# Patient Record
Sex: Female | Born: 1955 | Race: White | Hispanic: No | Smoking: Former smoker
Health system: Southern US, Community
[De-identification: ages and names within clinical notes are randomized; demographics above are authoritative.]

## PROBLEM LIST (undated history)

## (undated) DIAGNOSIS — T8859XA Other complications of anesthesia, initial encounter: Secondary | ICD-10-CM

## (undated) DIAGNOSIS — T4145XA Adverse effect of unspecified anesthetic, initial encounter: Secondary | ICD-10-CM

---

## 1997-03-22 HISTORY — PX: VAGINAL HYSTERECTOMY: SUR661

## 1997-07-31 ENCOUNTER — Inpatient Hospital Stay (HOSPITAL_COMMUNITY): Admission: RE | Admit: 1997-07-31 | Discharge: 1997-08-01 | Payer: Self-pay | Admitting: Gynecology

## 1998-03-28 ENCOUNTER — Other Ambulatory Visit: Admission: RE | Admit: 1998-03-28 | Discharge: 1998-03-28 | Payer: Self-pay | Admitting: Gynecology

## 1998-03-31 ENCOUNTER — Other Ambulatory Visit: Admission: RE | Admit: 1998-03-31 | Discharge: 1998-03-31 | Payer: Self-pay | Admitting: Gynecology

## 2000-06-10 ENCOUNTER — Other Ambulatory Visit: Admission: RE | Admit: 2000-06-10 | Discharge: 2000-06-10 | Payer: Self-pay | Admitting: Gynecology

## 2001-08-31 ENCOUNTER — Other Ambulatory Visit: Admission: RE | Admit: 2001-08-31 | Discharge: 2001-08-31 | Payer: Self-pay | Admitting: Gynecology

## 2006-08-28 ENCOUNTER — Emergency Department (HOSPITAL_COMMUNITY): Admission: EM | Admit: 2006-08-28 | Discharge: 2006-08-28 | Payer: Self-pay | Admitting: Emergency Medicine

## 2011-01-07 LAB — COMPREHENSIVE METABOLIC PANEL
Albumin: 3.8
Alkaline Phosphatase: 92
BUN: 6
Chloride: 106
Creatinine, Ser: 0.85
GFR calc non Af Amer: >60
Glucose, Bld: 105 — ABNORMAL HIGH
Potassium: 3.9
Total Bilirubin: 0.6

## 2011-01-07 LAB — CBC
HCT: 45.9
Hemoglobin: 15.6 — ABNORMAL HIGH
MCV: 90.9
Platelets: 333
WBC: 13.4 — ABNORMAL HIGH

## 2011-01-07 LAB — URINALYSIS, ROUTINE W REFLEX MICROSCOPIC
Glucose, UA: NEGATIVE
Hgb urine dipstick: NEGATIVE
Ketones, ur: NEGATIVE
Protein, ur: NEGATIVE
pH: 6.5

## 2011-01-07 LAB — DIFFERENTIAL
Basophils Absolute: 0
Basophils Relative: 0
Lymphocytes Relative: 15
Monocytes Absolute: 1 — ABNORMAL HIGH
Neutro Abs: 10.2 — ABNORMAL HIGH
Neutrophils Relative %: 76

## 2011-01-07 LAB — LIPASE, BLOOD: Lipase: 21

## 2014-09-02 ENCOUNTER — Emergency Department (HOSPITAL_COMMUNITY): Payer: Self-pay

## 2014-09-02 ENCOUNTER — Observation Stay (HOSPITAL_COMMUNITY): Payer: Self-pay | Admitting: Anesthesiology

## 2014-09-02 ENCOUNTER — Encounter (HOSPITAL_COMMUNITY): Payer: Self-pay | Admitting: *Deleted

## 2014-09-02 ENCOUNTER — Observation Stay (HOSPITAL_COMMUNITY)
Admission: EM | Admit: 2014-09-02 | Discharge: 2014-09-03 | Disposition: A | Discharge status: Home / Self Care | Payer: Self-pay | Attending: General Surgery | Admitting: General Surgery

## 2014-09-02 ENCOUNTER — Encounter (HOSPITAL_COMMUNITY)
Admission: EM | Disposition: A | Discharge status: Home / Self Care | Payer: Self-pay | Source: Home / Self Care | Attending: Emergency Medicine

## 2014-09-02 DIAGNOSIS — K8010 Calculus of gallbladder with chronic cholecystitis without obstruction: Principal | ICD-10-CM | POA: Insufficient documentation

## 2014-09-02 DIAGNOSIS — I10 Essential (primary) hypertension: Secondary | ICD-10-CM | POA: Insufficient documentation

## 2014-09-02 DIAGNOSIS — Z87891 Personal history of nicotine dependence: Secondary | ICD-10-CM | POA: Insufficient documentation

## 2014-09-02 DIAGNOSIS — K219 Gastro-esophageal reflux disease without esophagitis: Secondary | ICD-10-CM | POA: Insufficient documentation

## 2014-09-02 DIAGNOSIS — K8000 Calculus of gallbladder with acute cholecystitis without obstruction: Secondary | ICD-10-CM | POA: Diagnosis present

## 2014-09-02 DIAGNOSIS — R52 Pain, unspecified: Secondary | ICD-10-CM

## 2014-09-02 DIAGNOSIS — K819 Cholecystitis, unspecified: Secondary | ICD-10-CM

## 2014-09-02 DIAGNOSIS — K8020 Calculus of gallbladder without cholecystitis without obstruction: Secondary | ICD-10-CM

## 2014-09-02 HISTORY — PX: CHOLECYSTECTOMY: SHX55

## 2014-09-02 HISTORY — DX: Other complications of anesthesia, initial encounter: T88.59XA

## 2014-09-02 HISTORY — DX: Adverse effect of unspecified anesthetic, initial encounter: T41.45XA

## 2014-09-02 LAB — CBC WITH DIFFERENTIAL/PLATELET
BASOS ABS: 0 10*3/uL (ref 0.0–0.1)
Basophils Relative: 0 % (ref 0–1)
EOS ABS: 0 10*3/uL (ref 0.0–0.7)
EOS PCT: 0 % (ref 0–5)
HCT: 38.6 % (ref 36.0–46.0)
Hemoglobin: 13.4 g/dL (ref 12.0–15.0)
LYMPHS ABS: 1.3 10*3/uL (ref 0.7–4.0)
LYMPHS PCT: 12 % (ref 12–46)
MCH: 30.2 pg (ref 26.0–34.0)
MCHC: 34.7 g/dL (ref 30.0–36.0)
MCV: 86.9 fL (ref 78.0–100.0)
Monocytes Absolute: 0.5 10*3/uL (ref 0.1–1.0)
Monocytes Relative: 4 % (ref 3–12)
NEUTROS PCT: 84 % — AB (ref 43–77)
Neutro Abs: 9 10*3/uL — ABNORMAL HIGH (ref 1.7–7.7)
PLATELETS: 276 10*3/uL (ref 150–400)
RBC: 4.44 MIL/uL (ref 3.87–5.11)
RDW: 12.5 % (ref 11.5–15.5)
WBC: 10.8 10*3/uL — AB (ref 4.0–10.5)

## 2014-09-02 LAB — URINALYSIS, ROUTINE W REFLEX MICROSCOPIC
Bilirubin Urine: NEGATIVE
GLUCOSE, UA: NEGATIVE mg/dL
Hgb urine dipstick: NEGATIVE
Ketones, ur: NEGATIVE mg/dL
LEUKOCYTES UA: NEGATIVE
Nitrite: NEGATIVE
PH: 7.5 (ref 5.0–8.0)
Protein, ur: NEGATIVE mg/dL
SPECIFIC GRAVITY, URINE: 1.044 — AB (ref 1.005–1.030)
Urobilinogen, UA: 0.2 mg/dL (ref 0.0–1.0)

## 2014-09-02 LAB — COMPREHENSIVE METABOLIC PANEL
ALBUMIN: 3.9 g/dL (ref 3.5–5.0)
ALT: 26 U/L (ref 14–54)
ANION GAP: 10 (ref 5–15)
AST: 20 U/L (ref 15–41)
Alkaline Phosphatase: 82 U/L (ref 38–126)
BUN: 9 mg/dL (ref 6–20)
CO2: 25 mmol/L (ref 22–32)
CREATININE: 0.93 mg/dL (ref 0.44–1.00)
Calcium: 9.3 mg/dL (ref 8.9–10.3)
Chloride: 100 mmol/L — ABNORMAL LOW (ref 101–111)
GFR calc Af Amer: >60 mL/min (ref >60)
GFR calc non Af Amer: >60 mL/min (ref >60)
Glucose, Bld: 158 mg/dL — ABNORMAL HIGH (ref 65–99)
Potassium: 3.9 mmol/L (ref 3.5–5.1)
Sodium: 135 mmol/L (ref 135–145)
TOTAL PROTEIN: 7 g/dL (ref 6.5–8.1)
Total Bilirubin: 0.4 mg/dL (ref 0.3–1.2)

## 2014-09-02 LAB — I-STAT TROPONIN, ED: Troponin i, poc: 0 ng/mL (ref 0.00–0.08)

## 2014-09-02 LAB — LIPASE, BLOOD: LIPASE: 23 U/L (ref 22–51)

## 2014-09-02 SURGERY — LAPAROSCOPIC CHOLECYSTECTOMY WITH INTRAOPERATIVE CHOLANGIOGRAM
Anesthesia: General | Site: Abdomen

## 2014-09-02 MED ORDER — PHENYLEPHRINE HCL 10 MG/ML IJ SOLN
INTRAMUSCULAR | Status: DC | PRN
  Administered 2014-09-02: 40 ug via INTRAVENOUS

## 2014-09-02 MED ORDER — SODIUM CHLORIDE 0.9 % IV SOLN
INTRAVENOUS | Status: DC
  Administered 2014-09-02 (×2): via INTRAVENOUS

## 2014-09-02 MED ORDER — HEMOSTATIC AGENTS (NO CHARGE) OPTIME
TOPICAL | Status: DC | PRN
  Administered 2014-09-02: 1 via TOPICAL

## 2014-09-02 MED ORDER — OXYCODONE-ACETAMINOPHEN 5-325 MG PO TABS
1.0000 | ORAL_TABLET | Freq: Once | ORAL | Status: AC
  Administered 2014-09-02: 1 via ORAL
  Filled 2014-09-02: qty 1

## 2014-09-02 MED ORDER — ONDANSETRON HCL 4 MG/2ML IJ SOLN: 4.0000 mg | Freq: Four times a day (QID) | INTRAMUSCULAR | Status: DC | PRN

## 2014-09-02 MED ORDER — FENTANYL CITRATE (PF) 250 MCG/5ML IJ SOLN
INTRAMUSCULAR | Status: AC
  Filled 2014-09-02: qty 5

## 2014-09-02 MED ORDER — SODIUM CHLORIDE 0.9 % IR SOLN
Status: DC | PRN
  Administered 2014-09-02: 1000 mL

## 2014-09-02 MED ORDER — HYDROMORPHONE HCL 1 MG/ML IJ SOLN
INTRAMUSCULAR | Status: AC
  Filled 2014-09-02: qty 1

## 2014-09-02 MED ORDER — ENOXAPARIN SODIUM 40 MG/0.4ML ~~LOC~~ SOLN
40.0000 mg | SUBCUTANEOUS | Status: DC
  Filled 2014-09-02: qty 0.4

## 2014-09-02 MED ORDER — BUPIVACAINE-EPINEPHRINE (PF) 0.25% -1:200000 IJ SOLN
INTRAMUSCULAR | Status: AC
  Filled 2014-09-02: qty 30

## 2014-09-02 MED ORDER — ACETAMINOPHEN 650 MG RE SUPP: 650.0000 mg | Freq: Four times a day (QID) | RECTAL | Status: DC | PRN

## 2014-09-02 MED ORDER — BUPIVACAINE-EPINEPHRINE 0.25% -1:200000 IJ SOLN
INTRAMUSCULAR | Status: DC | PRN
  Administered 2014-09-02: 11 mL

## 2014-09-02 MED ORDER — MIDAZOLAM HCL 2 MG/2ML IJ SOLN
INTRAMUSCULAR | Status: AC
  Filled 2014-09-02: qty 2

## 2014-09-02 MED ORDER — IOHEXOL 300 MG/ML  SOLN
100.0000 mL | Freq: Once | INTRAMUSCULAR | Status: AC | PRN
  Administered 2014-09-02: 100 mL via INTRAVENOUS

## 2014-09-02 MED ORDER — ACETAMINOPHEN 325 MG PO TABS: 650.0000 mg | ORAL_TABLET | Freq: Four times a day (QID) | ORAL | Status: DC | PRN

## 2014-09-02 MED ORDER — LACTATED RINGERS IV SOLN
INTRAVENOUS | Status: DC | PRN
  Administered 2014-09-02 (×2): via INTRAVENOUS

## 2014-09-02 MED ORDER — HYDROMORPHONE HCL 1 MG/ML IJ SOLN
1.0000 mg | INTRAMUSCULAR | Status: DC | PRN
  Administered 2014-09-02: 1 mg via INTRAVENOUS
  Filled 2014-09-02: qty 1

## 2014-09-02 MED ORDER — CIPROFLOXACIN IN D5W 400 MG/200ML IV SOLN: 400.0000 mg | Freq: Two times a day (BID) | INTRAVENOUS | Status: DC

## 2014-09-02 MED ORDER — KETOROLAC TROMETHAMINE 30 MG/ML IJ SOLN: 30.0000 mg | Freq: Once | INTRAMUSCULAR | Status: DC | PRN

## 2014-09-02 MED ORDER — IOHEXOL 300 MG/ML  SOLN
INTRAMUSCULAR | Status: DC | PRN
  Administered 2014-09-02: 1 mL

## 2014-09-02 MED ORDER — SUCCINYLCHOLINE CHLORIDE 20 MG/ML IJ SOLN
INTRAMUSCULAR | Status: DC | PRN
  Administered 2014-09-02: 100 mg via INTRAVENOUS

## 2014-09-02 MED ORDER — LACTATED RINGERS IV SOLN: INTRAVENOUS | Status: DC

## 2014-09-02 MED ORDER — HYDRALAZINE HCL 20 MG/ML IJ SOLN: 10.0000 mg | Freq: Four times a day (QID) | INTRAMUSCULAR | Status: DC | PRN

## 2014-09-02 MED ORDER — 0.9 % SODIUM CHLORIDE (POUR BTL) OPTIME
TOPICAL | Status: DC | PRN
  Administered 2014-09-02: 1000 mL

## 2014-09-02 MED ORDER — FENTANYL CITRATE (PF) 100 MCG/2ML IJ SOLN
50.0000 ug | INTRAMUSCULAR | Status: AC | PRN
  Administered 2014-09-02 (×2): 50 ug via INTRAVENOUS
  Filled 2014-09-02: qty 2

## 2014-09-02 MED ORDER — DEXAMETHASONE SODIUM PHOSPHATE 10 MG/ML IJ SOLN
INTRAMUSCULAR | Status: DC | PRN
  Administered 2014-09-02: 10 mg via INTRAVENOUS

## 2014-09-02 MED ORDER — GLYCOPYRROLATE 0.2 MG/ML IJ SOLN
INTRAMUSCULAR | Status: DC | PRN
  Administered 2014-09-02: 0.6 mg via INTRAVENOUS

## 2014-09-02 MED ORDER — PROPOFOL 10 MG/ML IV BOLUS
INTRAVENOUS | Status: DC | PRN
  Administered 2014-09-02: 125 mg via INTRAVENOUS

## 2014-09-02 MED ORDER — FENTANYL CITRATE (PF) 100 MCG/2ML IJ SOLN
INTRAMUSCULAR | Status: DC | PRN
Start: 2014-09-02 — End: 2014-09-02
  Administered 2014-09-02 (×3): 50 ug via INTRAVENOUS

## 2014-09-02 MED ORDER — ENOXAPARIN SODIUM 40 MG/0.4ML ~~LOC~~ SOLN: 40.0000 mg | SUBCUTANEOUS | Status: DC

## 2014-09-02 MED ORDER — METRONIDAZOLE IN NACL 5-0.79 MG/ML-% IV SOLN
500.0000 mg | Freq: Once | INTRAVENOUS | Status: AC
  Administered 2014-09-02: 500 mg via INTRAVENOUS
  Filled 2014-09-02: qty 100

## 2014-09-02 MED ORDER — ONDANSETRON 4 MG PO TBDP
8.0000 mg | ORAL_TABLET | Freq: Once | ORAL | Status: AC
  Administered 2014-09-02: 8 mg via ORAL
  Filled 2014-09-02: qty 2

## 2014-09-02 MED ORDER — CEFTRIAXONE SODIUM 2 G IJ SOLR
2.0000 g | INTRAMUSCULAR | Status: DC
  Administered 2014-09-02: 2 g via INTRAVENOUS
  Filled 2014-09-02: qty 2

## 2014-09-02 MED ORDER — ONDANSETRON HCL 4 MG/2ML IJ SOLN
INTRAMUSCULAR | Status: DC | PRN
  Administered 2014-09-02: 4 mg via INTRAVENOUS

## 2014-09-02 MED ORDER — HYDROMORPHONE HCL 1 MG/ML IJ SOLN
0.2500 mg | INTRAMUSCULAR | Status: DC | PRN
  Administered 2014-09-02 (×2): 0.5 mg via INTRAVENOUS

## 2014-09-02 MED ORDER — ONDANSETRON HCL 4 MG/2ML IJ SOLN
4.0000 mg | INTRAMUSCULAR | Status: DC | PRN
  Administered 2014-09-02: 4 mg via INTRAVENOUS
  Filled 2014-09-02: qty 2

## 2014-09-02 MED ORDER — ONDANSETRON HCL 4 MG/2ML IJ SOLN: 4.0000 mg | INTRAMUSCULAR | Status: DC | PRN

## 2014-09-02 MED ORDER — NEOSTIGMINE METHYLSULFATE 10 MG/10ML IV SOLN
INTRAVENOUS | Status: DC | PRN
  Administered 2014-09-02: 4 mg via INTRAVENOUS

## 2014-09-02 MED ORDER — LIDOCAINE HCL (CARDIAC) 20 MG/ML IV SOLN
INTRAVENOUS | Status: DC | PRN
  Administered 2014-09-02: 100 mg via INTRAVENOUS

## 2014-09-02 MED ORDER — OXYCODONE-ACETAMINOPHEN 5-325 MG PO TABS: 1.0000 | ORAL_TABLET | ORAL | Status: DC | PRN

## 2014-09-02 MED ORDER — IOHEXOL 300 MG/ML  SOLN
25.0000 mL | Freq: Once | INTRAMUSCULAR | Status: AC | PRN
  Administered 2014-09-02: 25 mL via INTRAVENOUS

## 2014-09-02 MED ORDER — HYDROMORPHONE HCL 1 MG/ML IJ SOLN: 0.5000 mg | INTRAMUSCULAR | Status: DC | PRN

## 2014-09-02 MED ORDER — HYDROCODONE-ACETAMINOPHEN 5-325 MG PO TABS: 1.0000 | ORAL_TABLET | ORAL | Status: DC | PRN

## 2014-09-02 MED ORDER — OXYCODONE HCL 5 MG/5ML PO SOLN: 5.0000 mg | Freq: Once | ORAL | Status: DC | PRN

## 2014-09-02 MED ORDER — ROCURONIUM BROMIDE 100 MG/10ML IV SOLN
INTRAVENOUS | Status: DC | PRN
  Administered 2014-09-02: 35 mg via INTRAVENOUS
  Administered 2014-09-02: 10 mg via INTRAVENOUS

## 2014-09-02 MED ORDER — OXYCODONE HCL 5 MG PO TABS: 5.0000 mg | ORAL_TABLET | Freq: Once | ORAL | Status: DC | PRN

## 2014-09-02 MED ORDER — PROPOFOL 10 MG/ML IV BOLUS
INTRAVENOUS | Status: AC
  Filled 2014-09-02: qty 20

## 2014-09-02 MED ORDER — PROMETHAZINE HCL 25 MG/ML IJ SOLN: 6.2500 mg | INTRAMUSCULAR | Status: DC | PRN

## 2014-09-02 MED ORDER — SODIUM CHLORIDE 0.9 % IV BOLUS (SEPSIS)
500.0000 mL | Freq: Once | INTRAVENOUS | Status: AC
  Administered 2014-09-02: 500 mL via INTRAVENOUS

## 2014-09-02 SURGICAL SUPPLY — 49 items
APPLIER CLIP 5 13 M/L LIGAMAX5 (MISCELLANEOUS) ×3
APR CLP MED LRG 5 ANG JAW (MISCELLANEOUS) ×1
BAG SPEC RTRVL 10 TROC 200 (ENDOMECHANICALS) ×1
BLADE SURG ROTATE 9660 (MISCELLANEOUS) IMPLANT
CANISTER SUCTION 2500CC (MISCELLANEOUS) ×3 IMPLANT
CHLORAPREP W/TINT 26ML (MISCELLANEOUS) ×3 IMPLANT
CLIP APPLIE 5 13 M/L LIGAMAX5 (MISCELLANEOUS) ×1 IMPLANT
CLOSURE WOUND 1/2 X4 (GAUZE/BANDAGES/DRESSINGS) ×1
COVER MAYO STAND STRL (DRAPES) ×1 IMPLANT
COVER SURGICAL LIGHT HANDLE (MISCELLANEOUS) ×3 IMPLANT
DEVICE TROCAR PUNCTURE CLOSURE (ENDOMECHANICALS) ×2 IMPLANT
DRAPE C-ARM 42X72 X-RAY (DRAPES) ×1 IMPLANT
DRAPE LAPAROSCOPIC ABDOMINAL (DRAPES) ×2 IMPLANT
DRAPE UTILITY XL STRL (DRAPES) ×2 IMPLANT
ELECT REM PT RETURN 9FT ADLT (ELECTROSURGICAL) ×3
ELECTRODE REM PT RTRN 9FT ADLT (ELECTROSURGICAL) ×1 IMPLANT
GLOVE BIO SURGEON STRL SZ 6.5 (GLOVE) ×1 IMPLANT
GLOVE BIO SURGEON STRL SZ7 (GLOVE) ×5 IMPLANT
GLOVE BIO SURGEONS STRL SZ 6.5 (GLOVE) ×1
GLOVE BIOGEL PI IND STRL 6.5 (GLOVE) IMPLANT
GLOVE BIOGEL PI IND STRL 7.0 (GLOVE) IMPLANT
GLOVE BIOGEL PI IND STRL 7.5 (GLOVE) ×1 IMPLANT
GLOVE BIOGEL PI INDICATOR 6.5 (GLOVE) ×2
GLOVE BIOGEL PI INDICATOR 7.0 (GLOVE) ×6
GLOVE BIOGEL PI INDICATOR 7.5 (GLOVE) ×6
GOWN STRL REUS W/ TWL LRG LVL3 (GOWN DISPOSABLE) ×3 IMPLANT
GOWN STRL REUS W/TWL LRG LVL3 (GOWN DISPOSABLE) ×12
HEMOSTAT SNOW SURGICEL 2X4 (HEMOSTASIS) ×2 IMPLANT
KIT BASIN OR (CUSTOM PROCEDURE TRAY) ×3 IMPLANT
KIT ROOM TURNOVER OR (KITS) ×3 IMPLANT
LIQUID BAND (GAUZE/BANDAGES/DRESSINGS) ×3 IMPLANT
NS IRRIG 1000ML POUR BTL (IV SOLUTION) ×3 IMPLANT
PAD ARMBOARD 7.5X6 YLW CONV (MISCELLANEOUS) ×3 IMPLANT
POUCH RETRIEVAL ECOSAC 10 (ENDOMECHANICALS) ×1 IMPLANT
POUCH RETRIEVAL ECOSAC 10MM (ENDOMECHANICALS) ×2
SCISSORS LAP 5X35 DISP (ENDOMECHANICALS) ×3 IMPLANT
SET CHOLANGIOGRAPH 5 50 .035 (SET/KITS/TRAYS/PACK) ×1 IMPLANT
SET IRRIG TUBING LAPAROSCOPIC (IRRIGATION / IRRIGATOR) ×3 IMPLANT
SLEEVE ENDOPATH XCEL 5M (ENDOMECHANICALS) ×6 IMPLANT
SPECIMEN JAR SMALL (MISCELLANEOUS) ×3 IMPLANT
STRIP CLOSURE SKIN 1/2X4 (GAUZE/BANDAGES/DRESSINGS) ×1 IMPLANT
SUT MNCRL AB 4-0 PS2 18 (SUTURE) ×3 IMPLANT
SUT VICRYL 0 UR6 27IN ABS (SUTURE) ×2 IMPLANT
TOWEL OR 17X24 6PK STRL BLUE (TOWEL DISPOSABLE) ×3 IMPLANT
TOWEL OR 17X26 10 PK STRL BLUE (TOWEL DISPOSABLE) ×1 IMPLANT
TRAY LAPAROSCOPIC (CUSTOM PROCEDURE TRAY) ×3 IMPLANT
TROCAR XCEL BLUNT TIP 100MML (ENDOMECHANICALS) ×3 IMPLANT
TROCAR XCEL NON-BLD 5MMX100MML (ENDOMECHANICALS) ×3 IMPLANT
TUBING INSUFFLATION (TUBING) ×3 IMPLANT

## 2014-09-02 NOTE — H&P (Signed)
Chief Complaint: abdominal pain HPI: Kelly Avery is a 59 year old female with no significant history presenting with right sided abdominal pain.  She reports having beans and cole slaw for dinner.  She awoke from a nap with abdominal pain, nausea, vomiting.  Previous symptoms in 2008 at which time it was recommended she has a cholecystectomy, but she got tired of waiting for surgery.  She has infrequent symptoms after she eats which usually resolve on their own and diet changes.  Symptoms are severe.  Time pattern is constant.  No modifying factors.  No aggravating or alleviating factors.  Characterized as "hurting pain."  Now improved to a 6/10.  Continues to have nausea, but no additional vomiting.  Last oral intake was 6PM.  She endorses having bradycardia and hard time "waking up from surgery" after her vaginal hysterectomy in 1999.  She does not take any medication. She does not have any family present and does not wish for [place] to call any at this point.  Denies chest pains, sob, DOE.    Work up shows; [place] with cholelithiasis, pericholecystic fluid and edema.  WBC 10.8, CT of A/P with cholelithiasis, normal LFTs.  Glucose 158.  Normal renal function and electrolytes, negative troponin.    Past Medical History  Diagnosis Date  . Anesthesia complication     bradycardia    Past Surgical History  Procedure Laterality Date  . Vaginal hysterectomy  1999    Family History  Problem Relation Age of Onset  . Alzheimer's disease Father   . Throat cancer Brother    Social History:  reports that she has quit smoking. She does not have any smokeless tobacco history on file. She reports that she does not drink alcohol or use illicit drugs.  Allergies: No Known Allergies   (Not in a hospital admission)  Results for orders placed or performed during the hospital encounter of 09/02/14 (from the past 48 hour(s))  CBC with Differential     Status: Abnormal   Collection Time: 09/02/14  3:43 AM   Result Value Ref Range   WBC 10.8 (H) 4.0 - 10.5 K/uL   RBC 4.44 3.87 - 5.11 MIL/uL   Hemoglobin 13.4 12.0 - 15.0 g/dL   HCT 38.6 36.0 - 46.0 %   MCV 86.9 78.0 - 100.0 fL   MCH 30.2 26.0 - 34.0 pg   MCHC 34.7 30.0 - 36.0 g/dL   RDW 12.5 11.5 - 15.5 %   Platelets 276 150 - 400 K/uL   Neutrophils Relative % 84 (H) 43 - 77 %   Neutro Abs 9.0 (H) 1.7 - 7.7 K/uL   Lymphocytes Relative 12 12 - 46 %   Lymphs Abs 1.3 0.7 - 4.0 K/uL   Monocytes Relative 4 3 - 12 %   Monocytes Absolute 0.5 0.1 - 1.0 K/uL   Eosinophils Relative 0 0 - 5 %   Eosinophils Absolute 0.0 0.0 - 0.7 K/uL   Basophils Relative 0 0 - 1 %   Basophils Absolute 0.0 0.0 - 0.1 K/uL  Comprehensive metabolic panel     Status: Abnormal   Collection Time: 09/02/14  3:43 AM  Result Value Ref Range   Sodium 135 135 - 145 mmol/L   Potassium 3.9 3.5 - 5.1 mmol/L   Chloride 100 (L) 101 - 111 mmol/L   CO2 25 22 - 32 mmol/L   Glucose, Bld 158 (H) 65 - 99 mg/dL   BUN 9 6 - 20 mg/dL   Creatinine,  Ser 0.93 0.44 - 1.00 mg/dL   Calcium 9.3 8.9 - 10.3 mg/dL   Total Protein 7.0 6.5 - 8.1 g/dL   Albumin 3.9 3.5 - 5.0 g/dL   AST 20 15 - 41 U/L   ALT 26 14 - 54 U/L   Alkaline Phosphatase 82 38 - 126 U/L   Total Bilirubin 0.4 0.3 - 1.2 mg/dL   GFR calc non Af Amer >60 >60 mL/min   GFR calc Af Amer >60 >60 mL/min    Comment: (NOTE) The eGFR has been calculated using the CKD EPI equation. This calculation has not been validated in all clinical situations. eGFR's persistently <60 mL/min signify possible Chronic Kidney Disease.    Anion gap 10 5 - 15  Lipase, blood     Status: None   Collection Time: 09/02/14  3:43 AM  Result Value Ref Range   Lipase 23 22 - 51 U/L  I-stat troponin, ED     Status: None   Collection Time: 09/02/14  4:00 AM  Result Value Ref Range   Troponin i, poc 0.00 0.00 - 0.08 ng/mL   Comment 3            Comment: Due to the release kinetics of cTnI, a negative result within the first hours of the onset of  symptoms does not rule out myocardial infarction with certainty. If myocardial infarction is still suspected, repeat the test at appropriate intervals.    Dg Chest 2 View  09/02/2014   CLINICAL DATA:  Abdominal pain and right-sided chest pain.  EXAM: CHEST  2 VIEW  COMPARISON:  Chest and abdomen 08/28/2006  FINDINGS: The heart size and mediastinal contours are within normal limits. Both lungs are clear. The visualized skeletal structures are unremarkable.  IMPRESSION: No active cardiopulmonary disease.   Electronically Signed   By: Lucienne Capers M.D.   On: 09/02/2014 04:43   Ct Abdomen Pelvis W Contrast  09/02/2014   CLINICAL DATA:  Severe RIGHT abdominal pain, vomiting and nausea for 1 day. History of hysterectomy.  EXAM: CT ABDOMEN AND PELVIS WITH CONTRAST  TECHNIQUE: Multidetector CT imaging of the abdomen and pelvis was performed using the standard protocol following bolus administration of intravenous contrast.  CONTRAST:  40m OMNIPAQUE IOHEXOL 300 MG/ML SOLN, 1032mOMNIPAQUE IOHEXOL 300 MG/ML SOLN  COMPARISON:  Abdominal ultrasound August 28, 2006  FINDINGS: LUNG BASES: Included view of the lung bases are clear. Visualized heart and pericardium are unremarkable.  SOLID ORGANS: The liver is diffusely hypodense consistent with hepatic steatosis with mild focal fatty sparing about the gallbladder fossa. Multiple gallstones measure up to 25 mm without CT findings of acute cholecystitis. Pancreas and adrenal glands are unremarkable. Spleen, gallbladder, pancreas and adrenal glands are unremarkable.  GASTROINTESTINAL TRACT: Small hiatal hernia. The stomach, small and large bowel are normal in course and caliber without inflammatory changes. Mild colonic diverticulosis. Normal appendix.  KIDNEYS/ URINARY TRACT: Kidneys are orthotopic, demonstrating symmetric enhancement. No nephrolithiasis, hydronephrosis or solid renal masses. The unopacified ureters are normal in course and caliber. Delayed imaging  through the kidneys demonstrates symmetric prompt contrast excretion within the proximal urinary collecting system. Urinary bladder is partially distended and unremarkable.  PERITONEUM/RETROPERITONEUM: Aortoiliac vessels are normal in course and caliber, mild calcific atherosclerosis. No lymphadenopathy by CT size criteria. Status post hysterectomy. No intraperitoneal free fluid nor free air.  SOFT TISSUE/OSSEOUS STRUCTURES: Non-suspicious. Grade 1 L2-3 retrolisthesis without spondylolysis. Moderate L4-5 and L5-S1 disc height loss, vacuum disc consistent with degenerative disc resulting in  moderate to severe RIGHT L5-S1 neural foraminal narrowing.  IMPRESSION: Cholelithiasis without CT findings of acute cholecystitis though, if clinically indicated, ultrasound would be more sensitive.  Hepatic steatosis.   Electronically Signed   By: Elon Alas M.D.   On: 09/02/2014 05:13   US Abdomen Limited Ruq  09/02/2014   CLINICAL DATA:  RIGHT upper quadrant pain.  EXAM: US ABDOMEN LIMITED - RIGHT UPPER QUADRANT  COMPARISON:  CT abdomen and pelvis September 02, 2014 at 4:52 a.m.  FINDINGS: Gallbladder:  Multiple echogenic gallstones measure up to 2.2 cm, which appear nonmobile at the neck. Borderline gallbladder wall thickening at 3 mm. Trace pericholecystic fluid. Sonographic Murphy's sign elicited.  Common bile duct:  Diameter: 5-6 mm.  Liver:  Echogenic liver consistent with hepatic steatosis with focal fatty sparing about the gallbladder fossa. No intrahepatic biliary dilatation. Hepatopetal portal vein.  IMPRESSION: Cholelithiasis and sonographic findings of acute cholecystitis.   Electronically Signed   By: Elon Alas M.D.   On: 09/02/2014 06:21    Review of Systems  All other systems reviewed and are negative.   Blood pressure 178/83, pulse 66, temperature 97.8 F (36.6 C), temperature source Oral, resp. rate 14, height 5' 5"  (1.651 m), SpO2 96 %. Physical Exam  Constitutional: She is oriented to  person, place, and time. She appears well-developed and well-nourished. No distress.  Cardiovascular: Normal rate, regular rhythm and intact distal pulses.  Exam reveals no gallop and no friction rub.   No murmur heard. Respiratory: Effort normal and breath sounds normal. No respiratory distress. She has no wheezes. She has no rales.  GI: Soft. Bowel sounds are normal. She exhibits no distension and no mass. There is no tenderness. There is no rebound and no guarding.  Musculoskeletal: Normal range of motion. She exhibits no edema or tenderness.  Neurological: She is alert and oriented to person, place, and time.  Skin: Skin is warm and dry. No rash noted. She is not diaphoretic. No erythema. No pallor.  Psychiatric: She has a normal mood and affect. Her behavior is normal. Judgment and thought content normal.     Assessment/Plan Acute calculous cholecystitis  -To OR this AM for laparoscopic cholecystectomy with IOC.  Surgical risks discussed including but not limited to infection, bleeding, , open cholecystectomy, injury to surrounding structures, anesthesia risks.  The patient verbalizes understanding and wishes to proceed. -pain control and anti-emetics  ID-DC cipro and flagyl, Flagyl already given.  Give Rocephin.  HTN-hydralazine PRN VTE prophylaxis-SCD/lovenox Evon Slack IVF  [facility]as [facility] ANP-BC Pager [phone] 09/02/2014, 8:06 AM

## 2014-09-02 NOTE — Transfer of Care (Signed)
Immediate Anesthesia Transfer of Care Note  Patient: Kelly Avery  Procedure(s) Performed: Procedure(s): LAPAROSCOPIC CHOLECYSTECTOMY  (N/A)  Patient Location: PACU  Anesthesia Type:General  Level of Consciousness: awake, alert  and oriented  Airway & Oxygen Therapy: Patient Spontanous Breathing and Patient connected to nasal cannula oxygen  Post-op Assessment: Report given to RN, Post -op Vital signs reviewed and stable and Patient moving all extremities X 4  Post vital signs: Reviewed and stable  Last Vitals:  Filed Vitals:   09/02/14 0830  BP: 185/84  Pulse: 65  Temp:   Resp:     Complications: No apparent anesthesia complications

## 2014-09-02 NOTE — ED Notes (Signed)
The pt has agreed to let us get an ekg

## 2014-09-02 NOTE — Anesthesia Procedure Notes (Signed)
Procedure Name: Intubation Date/Time: 09/02/2014 9:35 AM Performed by: Carmela Rima Pre-anesthesia Checklist: Patient being monitored, Suction available, Emergency Drugs available, Patient identified and Timeout performed Patient Re-evaluated:Patient Re-evaluated prior to inductionOxygen Delivery Method: Circle system utilized Preoxygenation: Pre-oxygenation with 100% oxygen Intubation Type: IV induction Ventilation: Mask ventilation without difficulty Laryngoscope Size: Mac and 3 Grade View: Grade I Tube type: Oral Tube size: 7.0 mm Number of attempts: 1 Placement Confirmation: positive ETCO2,  breath sounds checked- equal and bilateral and ETT inserted through vocal cords under direct vision Secured at: 21 cm Tube secured with: Tape Dental Injury: Teeth and Oropharynx as per pre-operative assessment

## 2014-09-02 NOTE — ED Notes (Signed)
The pt is c/o abd pain through to her chest for a few hours.  She has had gb problems since 2008.  She refuses to have a ekg

## 2014-09-02 NOTE — Anesthesia Preprocedure Evaluation (Addendum)
Anesthesia Evaluation  Patient identified by MRN, date of birth, ID band Patient awake    Reviewed: Allergy & Precautions, H&P , NPO status , Patient's Chart, lab work & pertinent test results  History of Anesthesia Complications (+) PROLONGED EMERGENCE and history of anesthetic complications  Airway Mallampati: II  TM Distance: >3 FB Neck ROM: full    Dental  (+) Missing, Dental Advidsory Given   Pulmonary former smoker,  breath sounds clear to auscultation        Cardiovascular negative cardio ROS  Rhythm:regular Rate:Normal     Neuro/Psych negative neurological ROS     GI/Hepatic negative GI ROS, GERD-  Poorly Controlled,Cholecystitis   Endo/Other  negative endocrine ROS  Renal/GU negative Renal ROS     Musculoskeletal   Abdominal   Peds  Hematology negative hematology ROS (+)   Anesthesia Other Findings   Reproductive/Obstetrics                          Anesthesia Physical Anesthesia Plan  ASA: II  Anesthesia Plan: General   Post-op Pain Management:    Induction: Intravenous  Airway Management Planned: Oral ETT  Additional Equipment:   Intra-op Plan:   Post-operative Plan: Extubation in OR  Informed Consent: I have reviewed the patients History and Physical, chart, labs and discussed the procedure including the risks, benefits and alternatives for the proposed anesthesia with the patient or authorized representative who has indicated his/her understanding and acceptance.   Dental advisory given and Dental Advisory Given  Plan Discussed with: CRNA, Anesthesiologist and Surgeon  Anesthesia Plan Comments:        Anesthesia Quick Evaluation

## 2014-09-02 NOTE — ED Provider Notes (Signed)
CSN: [id number]     Arrival date & time 09/02/14  0241 History   First MD Initiated Contact with Patient 09/02/14 0344     Chief Complaint  Patient presents with  . Abdominal Pain      HPI Pt was seen at 0345.  Per pt, c/o gradual onset and persistence of constant right sided abd "pain" for the past few hours.  Has been associated with multiple intermittent episodes of N/V as well as "loose stools."  Describes the abd pain as "sharp" and "shooting."  Denies diarrhea, no fevers, no back pain, no rash, no CP/SOB, no black or blood in stools or emesis.      History reviewed. No pertinent past medical history.   History reviewed. No pertinent past surgical history.  History  Substance Use Topics  . Smoking status: Never Smoker   . Smokeless tobacco: Not on file  . Alcohol Use: No    Review of Systems ROS: Statement: All systems negative except as marked or noted in the HPI; Constitutional: Negative for fever and chills. ; ; Eyes: Negative for eye pain, redness and discharge. ; ; ENMT: Negative for ear pain, hoarseness, nasal congestion, sinus pressure and sore throat. ; ; Cardiovascular: Negative for chest pain, palpitations, diaphoresis, dyspnea and peripheral edema. ; ; Respiratory: Negative for cough, wheezing and stridor. ; ; Gastrointestinal: +N/V, "loose stools," abd pain. Negative for diarrhea, blood in stool, hematemesis, jaundice and rectal bleeding. . ; ; Genitourinary: Negative for dysuria, flank pain and hematuria. ; ; Musculoskeletal: Negative for back pain and neck pain. Negative for swelling and trauma.; ; Skin: Negative for pruritus, rash, abrasions, blisters, bruising and skin lesion.; ; Neuro: Negative for headache, lightheadedness and neck stiffness. Negative for weakness, altered level of consciousness , altered mental status, extremity weakness, paresthesias, involuntary movement, seizure and syncope.        Allergies  Review of patient's allergies indicates no known  allergies.  Home Medications   Prior to Admission medications   Not on File   BP 166/92 mmHg  Pulse 68  Temp(Src) 97.8 F (36.6 C) (Oral)  Resp 12  Ht 5\' 5"  (1.651 m)  SpO2 98% Physical Exam  0350: Physical examination:  Nursing notes reviewed; Vital signs and O2 SAT reviewed;  Constitutional: Well developed, Well nourished, Well hydrated, Uncomfortable appearing.; Head:  Normocephalic, atraumatic; Eyes: EOMI, PERRL, No scleral icterus; ENMT: Mouth and pharynx normal, Mucous membranes moist; Neck: Supple, Full range of motion, No lymphadenopathy; Cardiovascular: Regular rate and rhythm, No gallop; Respiratory: Breath sounds clear & equal bilaterally, No wheezes.  Speaking full sentences with ease, Normal respiratory effort/excursion; Chest: Nontender, Movement normal; Abdomen: Soft, +RUQ > RLQ tender to palp. No rebound or guarding. Nondistended, Normal bowel sounds; Genitourinary: No CVA tenderness; Extremities: Pulses normal, No tenderness, No edema, No calf edema or asymmetry.; Neuro: AA&Ox3, Major CN grossly intact.  Speech clear. No gross focal motor or sensory deficits in extremities.; Skin: Color normal, Warm, Dry.   ED Course  Procedures     EKG Interpretation   Date/Time:  Monday September 02 2014 03:07:02 EDT Ventricular Rate:  61 PR Interval:  176 QRS Duration: 80 QT Interval:  444 QTC Calculation: 446 R Axis:   49 Text Interpretation:  Normal sinus rhythm Cannot rule out Anterior infarct  , age undetermined Baseline wander When compared with ECG of 08/28/2006 No  significant change was found Confirmed by [facility]  MD, Nicholos Johns [phone])  on 09/02/2014 4:31:27 AM  MDM  MDM Reviewed: previous chart, nursing note and vitals Reviewed previous: labs and ECG Interpretation: labs, ECG, x-ray and CT scan      Results for orders placed or performed during the hospital encounter of 09/02/14  CBC with Differential  Result Value Ref Range   WBC 10.8 (H) 4.0 - 10.5 K/uL    RBC 4.44 3.87 - 5.11 MIL/uL   Hemoglobin 13.4 12.0 - 15.0 g/dL   HCT 04.5 40.9 - 81.1 %   MCV 86.9 78.0 - 100.0 fL   MCH 30.2 26.0 - 34.0 pg   MCHC 34.7 30.0 - 36.0 g/dL   RDW 91.4 78.2 - 95.6 %   Platelets 276 150 - 400 K/uL   Neutrophils Relative % 84 (H) 43 - 77 %   Neutro Abs 9.0 (H) 1.7 - 7.7 K/uL   Lymphocytes Relative 12 12 - 46 %   Lymphs Abs 1.3 0.7 - 4.0 K/uL   Monocytes Relative 4 3 - 12 %   Monocytes Absolute 0.5 0.1 - 1.0 K/uL   Eosinophils Relative 0 0 - 5 %   Eosinophils Absolute 0.0 0.0 - 0.7 K/uL   Basophils Relative 0 0 - 1 %   Basophils Absolute 0.0 0.0 - 0.1 K/uL  Comprehensive metabolic panel  Result Value Ref Range   Sodium 135 135 - 145 mmol/L   Potassium 3.9 3.5 - 5.1 mmol/L   Chloride 100 (L) 101 - 111 mmol/L   CO2 25 22 - 32 mmol/L   Glucose, Bld 158 (H) 65 - 99 mg/dL   BUN 9 6 - 20 mg/dL   Creatinine, Ser 2.13 0.44 - 1.00 mg/dL   Calcium 9.3 8.9 - 08.6 mg/dL   Total Protein 7.0 6.5 - 8.1 g/dL   Albumin 3.9 3.5 - 5.0 g/dL   AST 20 15 - 41 U/L   ALT 26 14 - 54 U/L   Alkaline Phosphatase 82 38 - 126 U/L   Total Bilirubin 0.4 0.3 - 1.2 mg/dL   GFR calc non Af Amer >60 >60 mL/min   GFR calc Af Amer >60 >60 mL/min   Anion gap 10 5 - 15  Lipase, blood  Result Value Ref Range   Lipase 23 22 - 51 U/L  I-stat troponin, ED  Result Value Ref Range   Troponin i, poc 0.00 0.00 - 0.08 ng/mL   Comment 3           Dg Chest 2 View 09/02/2014   CLINICAL DATA:  Abdominal pain and right-sided chest pain.  EXAM: CHEST  2 VIEW  COMPARISON:  Chest and abdomen 08/28/2006  FINDINGS: The heart size and mediastinal contours are within normal limits. Both lungs are clear. The visualized skeletal structures are unremarkable.  IMPRESSION: No active cardiopulmonary disease.   Electronically Signed   By: Burman Nieves M.D.   On: 09/02/2014 04:43   Ct Abdomen Pelvis W Contrast 09/02/2014   CLINICAL DATA:  Severe RIGHT abdominal pain, vomiting and nausea for 1 day.  History of hysterectomy.  EXAM: CT ABDOMEN AND PELVIS WITH CONTRAST  TECHNIQUE: Multidetector CT imaging of the abdomen and pelvis was performed using the standard protocol following bolus administration of intravenous contrast.  CONTRAST:  25mL OMNIPAQUE IOHEXOL 300 MG/ML SOLN, OMNIPAQUE IOHEXOL 300 MG/ML SOLN  COMPARISON:  Abdominal ultrasound August 28, 2006  FINDINGS: LUNG BASES: Included view of the lung bases are clear. Visualized heart and pericardium are unremarkable.  SOLID ORGANS: The liver is diffusely hypodense consistent with  hepatic steatosis with mild focal fatty sparing about the gallbladder fossa. Multiple gallstones measure up to 25 mm without CT findings of acute cholecystitis. Pancreas and adrenal glands are unremarkable. Spleen, gallbladder, pancreas and adrenal glands are unremarkable.  GASTROINTESTINAL TRACT: Small hiatal hernia. The stomach, small and large bowel are normal in course and caliber without inflammatory changes. Mild colonic diverticulosis. Normal appendix.  KIDNEYS/ URINARY TRACT: Kidneys are orthotopic, demonstrating symmetric enhancement. No nephrolithiasis, hydronephrosis or solid renal masses. The unopacified ureters are normal in course and caliber. Delayed imaging through the kidneys demonstrates symmetric prompt contrast excretion within the proximal urinary collecting system. Urinary bladder is partially distended and unremarkable.  PERITONEUM/RETROPERITONEUM: Aortoiliac vessels are normal in course and caliber, mild calcific atherosclerosis. No lymphadenopathy by CT size criteria. Status post hysterectomy. No intraperitoneal free fluid nor free air.  SOFT TISSUE/OSSEOUS STRUCTURES: Non-suspicious. Grade 1 L2-3 retrolisthesis without spondylolysis. Moderate L4-5 and L5-S1 disc height loss, vacuum disc consistent with degenerative disc resulting in moderate to severe RIGHT L5-S1 neural foraminal narrowing.  IMPRESSION: Cholelithiasis without CT findings of acute  cholecystitis though, if clinically indicated, ultrasound would be more sensitive.  Hepatic steatosis.   Electronically Signed   By: Awilda Metro M.D.   On: 09/02/2014 05:13   US Abdomen Limited Ruq 09/02/2014   CLINICAL DATA:  RIGHT upper quadrant pain.  EXAM: US ABDOMEN LIMITED - RIGHT UPPER QUADRANT  COMPARISON:  CT abdomen and pelvis September 02, 2014 at 4:52 a.m.  FINDINGS: Gallbladder:  Multiple echogenic gallstones measure up to 2.2 cm, which appear nonmobile at the neck. Borderline gallbladder wall thickening at 3 mm. Trace pericholecystic fluid. Sonographic Murphy's sign elicited.  Common bile duct:  Diameter: 5-6 mm.  Liver:  Echogenic liver consistent with hepatic steatosis with focal fatty sparing about the gallbladder fossa. No intrahepatic biliary dilatation. Hepatopetal portal vein.  IMPRESSION: Cholelithiasis and sonographic findings of acute cholecystitis.   Electronically Signed   By: Awilda Metro M.D.   On: 09/02/2014 06:21    1610:  Pt continues to c/o abd pain and nausea despite multiple doses of IV meds for same. [place] with acute cholecystitis.  IV abx ordered. Dx and testing d/w pt.  Questions answered.  Verb understanding, agreeable to admit.  T/C to General Surgery Dr. Lindie Spruce, case discussed, including:  HPI, pertinent PM/SHx, VS/PE, dx testing, ED course and treatment:  Agreeable to come to ED for evaluation to admit.    Samuel Jester, DO 09/04/14 1553

## 2014-09-02 NOTE — Anesthesia Postprocedure Evaluation (Signed)
  Anesthesia Post-op Note  Patient: Kelly Avery  Procedure(s) Performed: Procedure(s): LAPAROSCOPIC CHOLECYSTECTOMY  (N/A)  Patient Location: PACU  Anesthesia Type:General  Level of Consciousness: awake and alert   Airway and Oxygen Therapy: Patient Spontanous Breathing  Post-op Pain: mild  Post-op Assessment: Post-op Vital signs reviewed              Post-op Vital Signs: Reviewed  Last Vitals:  Filed Vitals:   09/02/14 1303  BP: 124/64  Pulse: 63  Temp: 37 C  Resp: 16    Complications: No apparent anesthesia complications

## 2014-09-02 NOTE — Op Note (Signed)
Preoperative diagnosis: symptomatic cholelithiasis Postoperative diagnosis: same as above Procedure: laparoscopic cholecystectomy Surgeon: Dr Harden Mo Asst: Jorje Guild, PA-C Anesthesia: general EBL: minimal Drains none Specimen gb and contents to pathology Complications: none Sponge count correct at completion Disposition to recovery stable  Indications: This is a 51 yof with symptoms that appear to be related to her gallbladder and cholelithiasis on her ultrasound She was seen in the ER and appears to have acute cholecystitis. We discussed proceeding with her laparoscopic cholecystectomy.  Procedure: After informed consent was obtained the patient was taken to the operating room. She was given antibiotics. Sequential compression devices were on her legs. She was placed under general anesthesia without complication. Her abdomen was prepped and draped in the standard sterile surgical fashion. A surgical timeout was then performed.  I infiltrated marcaine below the umbilicus.  I made an incision and then entered the fascia sharply.  I then entered the peritoneum bluntly. I placed a 0 vicryl pursestring suture and inserted a hasson trocar.  I then inserted 3 further 5 mm trocars in the epigastrium and ruq. the gallbladder needed to be decompressed to handle. She had acute on chronic cholecystitis. . I then was able to retract the gallbladder cephalad and lateral.  I then was able to identify the cystic duct and clearly had the critical view of safety.I then clipped the cystic duct and divided it. The duct was viable and the clips traversed the duct. I then treated the artery in similar fashion as it was immediately adjacent to the duct. I then removed the gallbladder from the liver bed and placed it in a bag. It was then removed from the umbilical incision. I then obtained hemostasis and irrigated. I did place a piece of surgicel snow in the bed. I then removed the umbilical trocar and  closed with 0 vicryl and the endoclose device.  I then desufflated the abdomen and removed all my remaining trocars. I then close these with 4-0 Monocryl and Dermabond. She tolerated this well was extubated and transferred to the recovery room in stable condition.

## 2014-09-03 ENCOUNTER — Encounter (HOSPITAL_COMMUNITY): Payer: Self-pay | Admitting: General Surgery

## 2014-09-03 MED ORDER — HYDROCODONE-ACETAMINOPHEN 5-325 MG PO TABS: 1.0000 | ORAL_TABLET | Freq: Four times a day (QID) | ORAL | Status: AC | PRN

## 2014-09-03 NOTE — Discharge Instructions (Signed)
Your appointment is at 3:15pm, please arrive at least 30 min before your appointment to complete your check in paperwork.  If you are unable to arrive 30 min prior to your appointment time we may have to cancel or reschedule you.  LAPAROSCOPIC SURGERY: POST OP INSTRUCTIONS  1. DIET: Follow a light bland diet the first 24 hours after arrival home, such as soup, liquids, crackers, etc. Be sure to include lots of fluids daily. Avoid fast food or heavy meals as your are more likely to get nauseated. Eat a low fat the next few days after surgery.  2. Take your usually prescribed home medications unless otherwise directed. 3. PAIN CONTROL:  1. Pain is best controlled by a usual combination of three different methods TOGETHER:  1. Ice/Heat 2. Over the counter pain medication 3. Prescription pain medication 2. Most patients will experience some swelling and bruising around the incisions. Ice packs or heating pads (30-60 minutes up to 6 times a day) will help. Use ice for the first few days to help decrease swelling and bruising, then switch to heat to help relax tight/sore spots and speed recovery. Some people prefer to use ice alone, heat alone, alternating between ice & heat. Experiment to what works for you. Swelling and bruising can take several weeks to resolve.  3. It is helpful to take an over-the-counter pain medication regularly for the first few weeks. Choose one of the following that works best for you:  1. Naproxen (Aleve, etc) Two 220mg  tabs twice a day 2. Ibuprofen (Advil, etc) Three 200mg  tabs four times a day (every meal & bedtime) 3. Acetaminophen (Tylenol, etc) 500-650mg  four times a day (every meal & bedtime) 4. A prescription for pain medication (such as oxycodone, hydrocodone, etc) should be given to you upon discharge. Take your pain medication as prescribed.  1. If you are having problems/concerns with the prescription medicine (does not control pain, nausea, vomiting, rash, itching,  etc), please call us [phone] to see if we need to switch you to a different pain medicine that will work better for you and/or control your side effect better. 2. If you need a refill on your pain medication, please contact your pharmacy. They will contact our office to request authorization. Prescriptions will not be filled after 5 pm or on week-ends. 4. Avoid getting constipated. Between the surgery and the pain medications, it is common to experience some constipation. Increasing fluid intake and taking a fiber supplement (such as Metamucil, Citrucel, FiberCon, MiraLax, etc) 1-2 times a day regularly will usually help prevent this problem from occurring. A mild laxative (prune juice, Milk of Magnesia, MiraLax, etc) should be taken according to package directions if there are no bowel movements after 48 hours.  5. Watch out for diarrhea. If you have many loose bowel movements, simplify your diet to bland foods & liquids for a few days. Stop any stool softeners and decrease your fiber supplement. Switching to mild anti-diarrheal medications (Kayopectate, Pepto Bismol) can help. If this worsens or does not improve, please call us. 6. Wash / shower every day. You may shower over the dressings as they are waterproof. Continue to shower over incision(s) after the dressing is off. 7. Remove your waterproof bandages 5 days after surgery. You may leave the incision open to air. You may replace a dressing/Band-Aid to cover the incision for comfort if you wish.  8. ACTIVITIES as tolerated:  1. You may resume regular (light) daily activities beginning the next day--such as  walking, climbing stairs--gradually increasing activities as tolerated. If you can walk 30 minutes without difficulty, it is safe to try more intense activity such as jogging, treadmill, bicycling, low-impact aerobics, swimming, etc. °2. Save the most intensive and strenuous activity for last such as sit-ups, heavy lifting,  contact sports, etc Refrain from any heavy lifting or straining until you are off narcotics for pain control.  °3. DO NOT PUSH THROUGH PAIN. Let pain be your guide: If it hurts to do something, don't do it. Pain is your body warning you to avoid that activity for another week until the pain goes down. °4. You may drive when you are no longer taking prescription pain medication, you can comfortably wear a seatbelt, and you can safely maneuver your car and apply brakes. °5. You may have sexual intercourse when it is comfortable.  °9. FOLLOW UP in our office  °1. Please call [facility] at [phone] to set up an appointment to see your surgeon in the office for a follow-up appointment approximately 2-3 weeks after your surgery. °2. Make sure that you call for this appointment the day you arrive home to insure a convenient appointment time. °     10. IF YOU HAVE DISABILITY OR FAMILY LEAVE FORMS, BRING THEM TO THE               OFFICE FOR PROCESSING.  ° °WHEN TO CALL US [phone]:  °1. Poor pain control °2. Reactions / problems with new medications (rash/itching, nausea, etc)  °3. Fever over 101.5 F (38.5 C) °4. Inability to urinate °5. Nausea and/or vomiting °6. Worsening swelling or bruising °7. Continued bleeding from incision. °8. Increased pain, redness, or drainage from the incision ° °The clinic staff is available to answer your questions during regular business hours (8:30am-5pm). Please don’t hesitate to call and ask to speak to one of our nurses for clinical concerns.  °If you have a medical emergency, go to the nearest emergency room or call 911.  °A surgeon from [facility] is always on call at the hospitals  ° °[facility]  °[address], [address], [place], [place] [address] ?  °MAIN: [phone] ? TOLL FREE: [phone] ?  °FAX [phone]  °[email or web address] ° °

## 2014-09-03 NOTE — Progress Notes (Signed)
Pt discharge instructions/prescriptions given and explained to pt.  Pt verbalizes understanding of all orders/instructions and denies any questions at this time.  IV removed and site CDI.  Pt discharged to home via volunteer w/c services with all belongings in no s/s of distress. Car keys returned to pt. Kelly Avery

## 2014-09-03 NOTE — Discharge Summary (Signed)
[facility] Discharge Summary   Patient ID: Kelly Avery MRN: [id number] DOB/AGE: [birth year] 61 y.o.  Admit date: 09/02/2014 Discharge date: 09/03/2014  Admitting Diagnosis: Acute on chronic calculous cholecystitis  Discharge Diagnosis Patient Active Problem List   Diagnosis Date Noted  . Acute calculous cholecystitis 09/02/2014    Consultants None  Imaging: Dg Chest 2 View  09/02/2014   CLINICAL DATA:  Abdominal pain and right-sided chest pain.  EXAM: CHEST  2 VIEW  COMPARISON:  Chest and abdomen 08/28/2006  FINDINGS: The heart size and mediastinal contours are within normal limits. Both lungs are clear. The visualized skeletal structures are unremarkable.  IMPRESSION: No active cardiopulmonary disease.   Electronically Signed   By: Burman Nieves M.D.   On: 09/02/2014 04:43   Ct Abdomen Pelvis W Contrast  09/02/2014   CLINICAL DATA:  Severe RIGHT abdominal pain, vomiting and nausea for 1 day. History of hysterectomy.  EXAM: CT ABDOMEN AND PELVIS WITH CONTRAST  TECHNIQUE: Multidetector CT imaging of the abdomen and pelvis was performed using the standard protocol following bolus administration of intravenous contrast.  CONTRAST:  25mL OMNIPAQUE IOHEXOL 300 MG/ML SOLN, OMNIPAQUE IOHEXOL 300 MG/ML SOLN  COMPARISON:  Abdominal ultrasound August 28, 2006  FINDINGS: LUNG BASES: Included view of the lung bases are clear. Visualized heart and pericardium are unremarkable.  SOLID ORGANS: The liver is diffusely hypodense consistent with hepatic steatosis with mild focal fatty sparing about the gallbladder fossa. Multiple gallstones measure up to 25 mm without CT findings of acute cholecystitis. Pancreas and adrenal glands are unremarkable. Spleen, gallbladder, pancreas and adrenal glands are unremarkable.  GASTROINTESTINAL TRACT: Small hiatal hernia. The stomach, small and large bowel are normal in course and caliber without inflammatory changes. Mild colonic diverticulosis.  Normal appendix.  KIDNEYS/ URINARY TRACT: Kidneys are orthotopic, demonstrating symmetric enhancement. No nephrolithiasis, hydronephrosis or solid renal masses. The unopacified ureters are normal in course and caliber. Delayed imaging through the kidneys demonstrates symmetric prompt contrast excretion within the proximal urinary collecting system. Urinary bladder is partially distended and unremarkable.  PERITONEUM/RETROPERITONEUM: Aortoiliac vessels are normal in course and caliber, mild calcific atherosclerosis. No lymphadenopathy by CT size criteria. Status post hysterectomy. No intraperitoneal free fluid nor free air.  SOFT TISSUE/OSSEOUS STRUCTURES: Non-suspicious. Grade 1 L2-3 retrolisthesis without spondylolysis. Moderate L4-5 and L5-S1 disc height loss, vacuum disc consistent with degenerative disc resulting in moderate to severe RIGHT L5-S1 neural foraminal narrowing.  IMPRESSION: Cholelithiasis without CT findings of acute cholecystitis though, if clinically indicated, ultrasound would be more sensitive.  Hepatic steatosis.   Electronically Signed   By: Awilda Metro M.D.   On: 09/02/2014 05:13   US Abdomen Limited Ruq  09/02/2014   CLINICAL DATA:  RIGHT upper quadrant pain.  EXAM: US ABDOMEN LIMITED - RIGHT UPPER QUADRANT  COMPARISON:  CT abdomen and pelvis September 02, 2014 at 4:52 a.m.  FINDINGS: Gallbladder:  Multiple echogenic gallstones measure up to 2.2 cm, which appear nonmobile at the neck. Borderline gallbladder wall thickening at 3 mm. Trace pericholecystic fluid. Sonographic Murphy's sign elicited.  Common bile duct:  Diameter: 5-6 mm.  Liver:  Echogenic liver consistent with hepatic steatosis with focal fatty sparing about the gallbladder fossa. No intrahepatic biliary dilatation. Hepatopetal portal vein.  IMPRESSION: Cholelithiasis and sonographic findings of acute cholecystitis.   Electronically Signed   By: Awilda Metro M.D.   On: 09/02/2014 06:21    Procedures Dr. Dwain Sarna  (09/03/14) - Laparoscopic Cholecystectomy with [facility]   Hospital Course:  59 year old female with no significant history presenting with right sided abdominal pain. She reports having beans and cole slaw for dinner. She awoke from a nap with abdominal pain, nausea, vomiting. Previous symptoms in 2008 at which time it was recommended she has a cholecystectomy, but she got tired of waiting for surgery. She has infrequent symptoms after she eats which usually resolve on their own and diet changes. Symptoms are severe. Time pattern is constant. No modifying factors. No aggravating or alleviating factors. Characterized as "hurting pain." Now improved to a 6/10. Continues to have nausea, but no additional vomiting. Last oral intake was 6PM. She endorses having bradycardia and hard time "waking up from surgery" after her vaginal hysterectomy in 1999. She does not take any medication.    Workup showed chronic with acute cholecystitis.  WBC was midlly elevated, LFT's were normal.  Patient was admitted and underwent procedure listed above.  Tolerated procedure well and was transferred to the floor.  Diet was advanced as tolerated.  On POD #1, the patient was voiding well, tolerating diet, ambulating well, pain well controlled, vital signs stable, incisions c/d/i and felt stable for discharge home.  Patient will follow up in our office in 3 weeks and knows to call with questions or concerns.  Physical Exam: General:  Alert, NAD, pleasant, comfortable Abd:  Soft, ND, mild tenderness, incisions C/D/I, minor ecchymosis at incision sites     Medication List    TAKE these medications        ALLERGY PO  Take 1 tablet by mouth daily as needed (allergies).     HYDROcodone-acetaminophen 5-325 MG per tablet  Commonly known as:  NORCO/VICODIN  Take 1-2 tablets by mouth every 6 (six) hours as needed for moderate pain.     ibuprofen 200 MG tablet  Commonly known as:  ADVIL,MOTRIN  Take 600 mg by mouth  every 6 (six) hours as needed for moderate pain.         Follow-up Information    Follow up with [facility]ICE [place] On 09/24/2014.   Why:  For post-operation check. Your appointment is at 3:15pm, please arrive at least 30 min before your appointment to complete your check in paperwork.  If you are unable to arrive 30 min prior to your appointment time we may have to cancel or reschedule you   Contact information:   [address] [place] [address] [phone]      Signed: Nonie Hoyer, [facility] [phone]  09/03/2014, 10:05 AM

## 2016-04-08 IMAGING — CT CT ABD-PELV W/ CM
2 of 5 series · 15 of 46 positions shown, 17 images · IV contrast (omnipaque)
Comparison: Abdominal ultrasound August 28, 2006

CLINICAL DATA: Severe RIGHT abdominal pain, vomiting and nausea for
1 day. History of hysterectomy.

EXAM:
CT ABDOMEN AND PELVIS WITH CONTRAST
TECHNIQUE: Multidetector CT imaging of the abdomen and pelvis was performed
using the standard protocol following bolus administration of
intravenous contrast.
CONTRAST:  25mL OMNIPAQUE IOHEXOL 300 MG/ML SOLN, 100mL OMNIPAQUE
IOHEXOL 300 MG/ML SOLN

[Series 3: abd/ pelvis 5.0 i30f 1 · axial · 0.91mm/px · z∈[+753,+1158]mm · 12 of 91 slices shown, 14 images]
[im 5/91  soft-tissue]
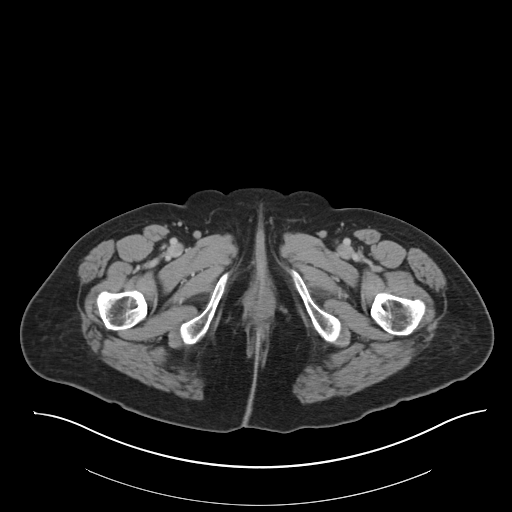
[im 5/91  bone]
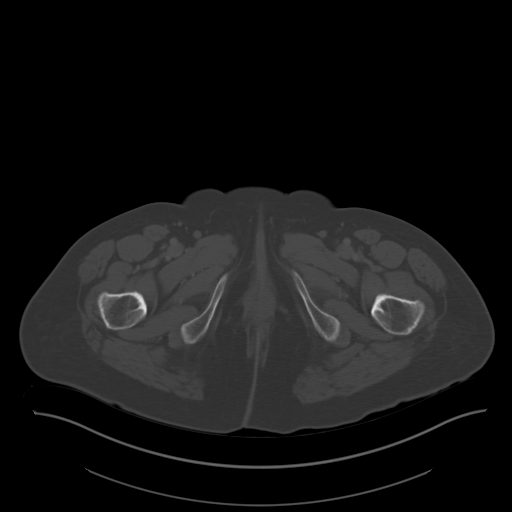
[im 14/91  soft-tissue]
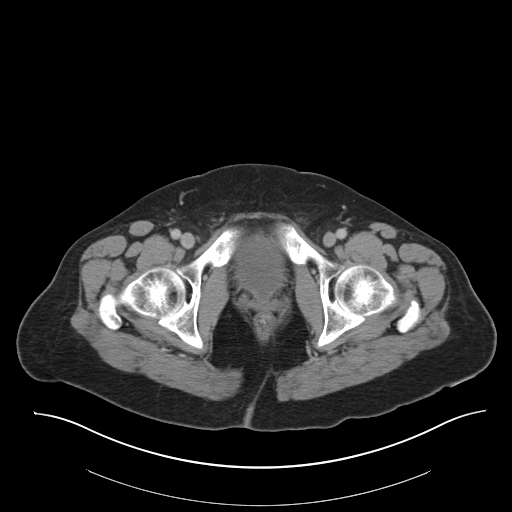
[im 19/91  soft-tissue]
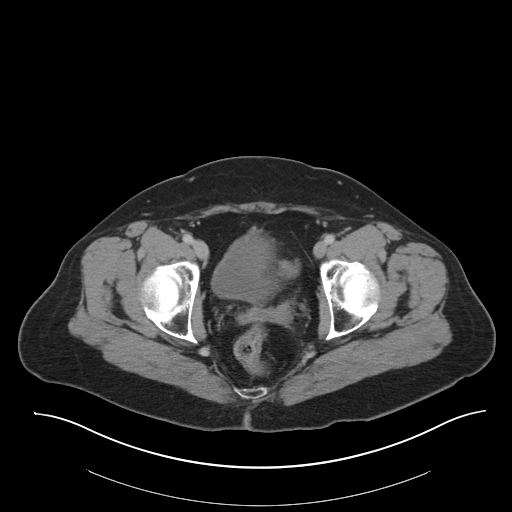
[im 28/91  soft-tissue]
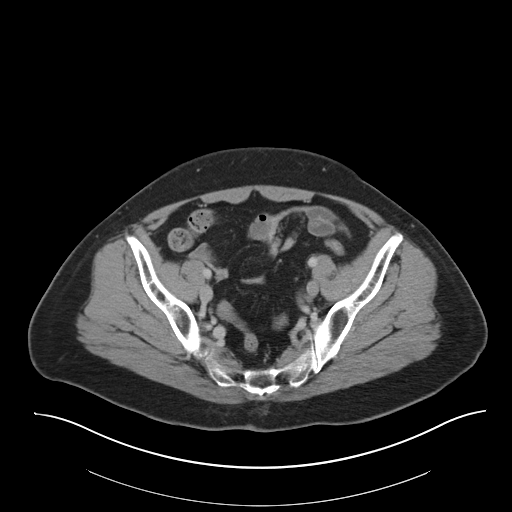
[im 37/91  soft-tissue]
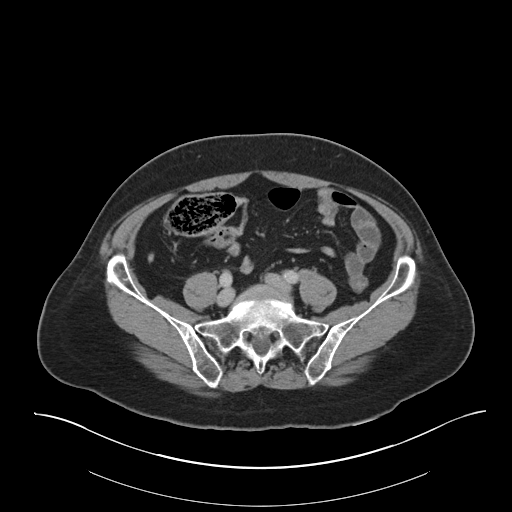
[im 41/91  soft-tissue]
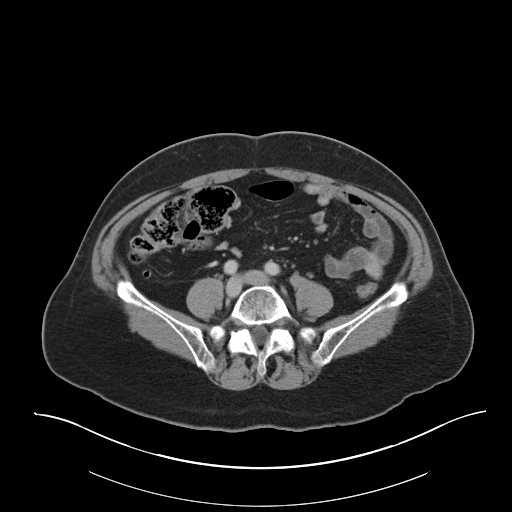
[im 50/91  soft-tissue]
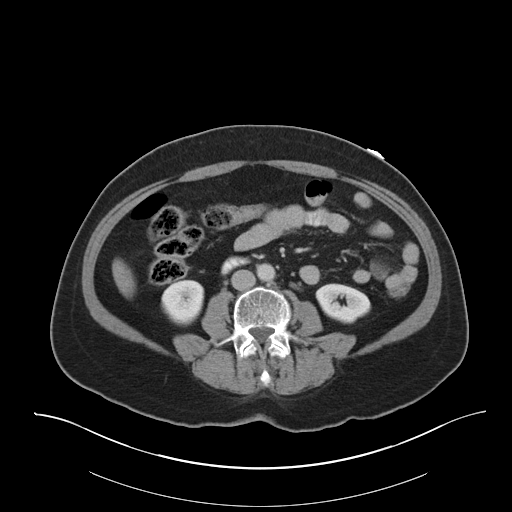
[im 55/91  soft-tissue]
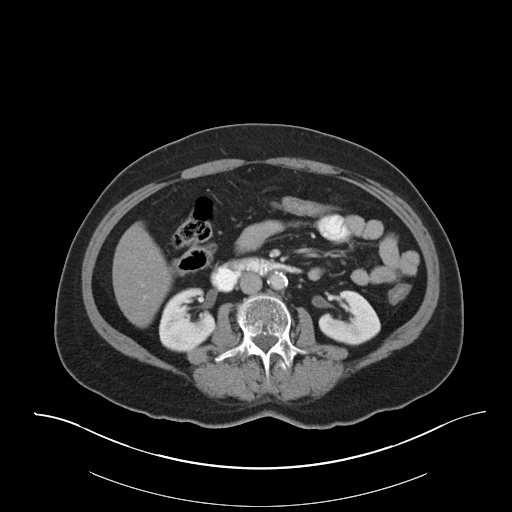
[im 64/91  soft-tissue]
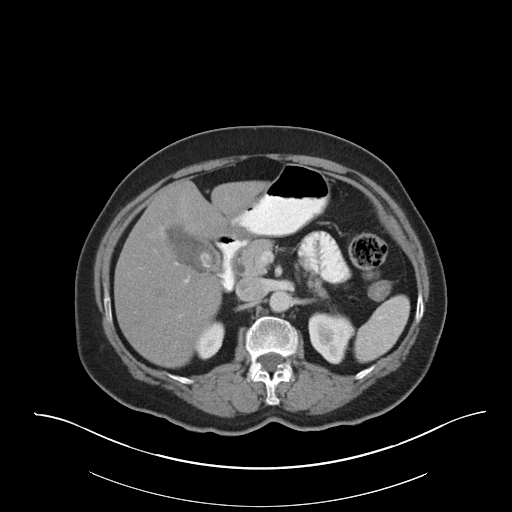
[im 64/91  bone]
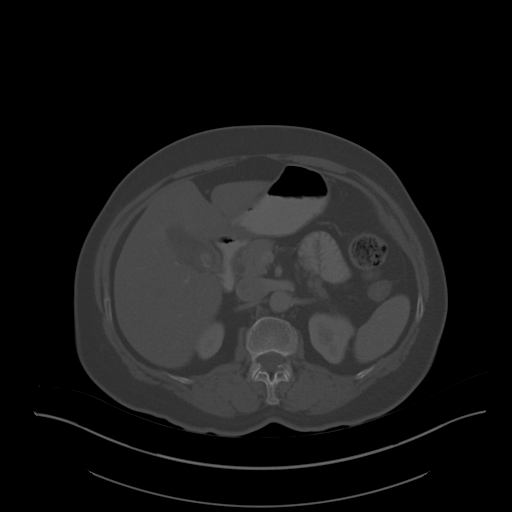
[im 73/91  soft-tissue]
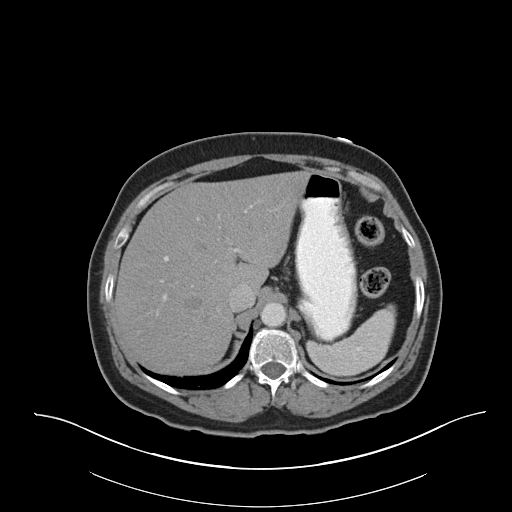
[im 77/91  soft-tissue]
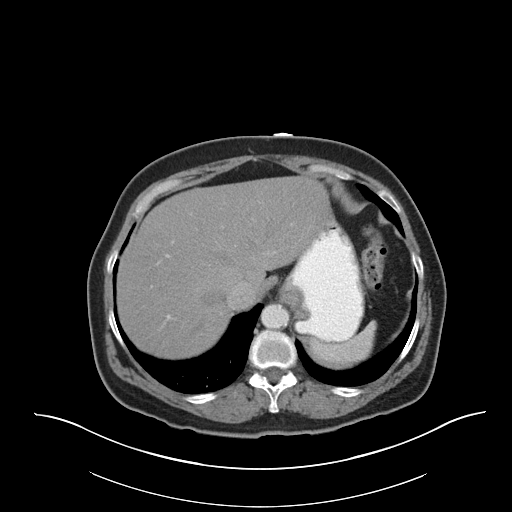
[im 86/91  soft-tissue]
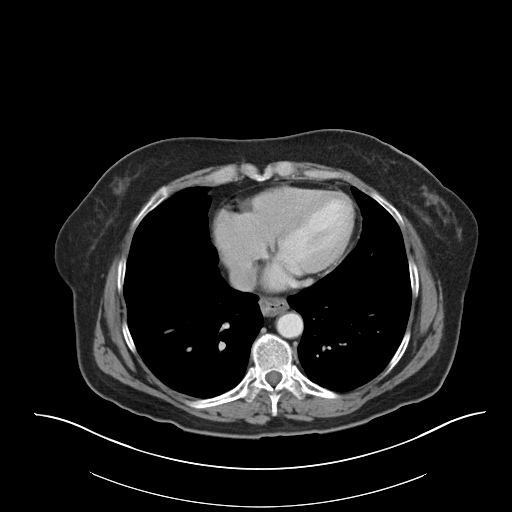

[Series 6: coronals · coronal · 0.70mm/px · 3 of 140 slices shown]
[im 47/140  soft-tissue]
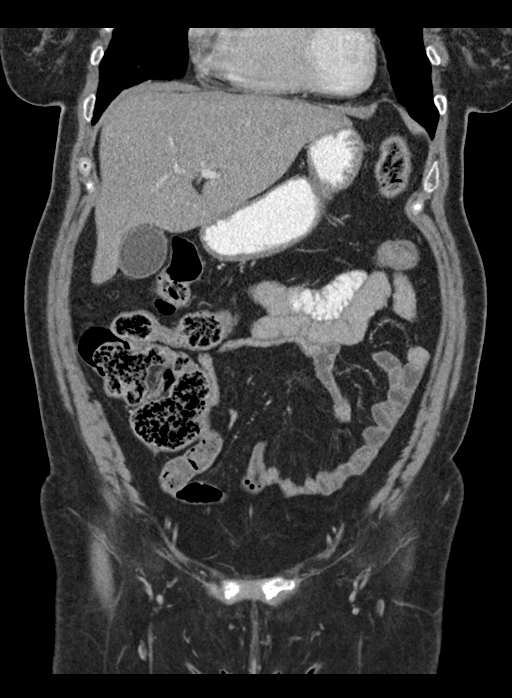
[im 62/140  soft-tissue]
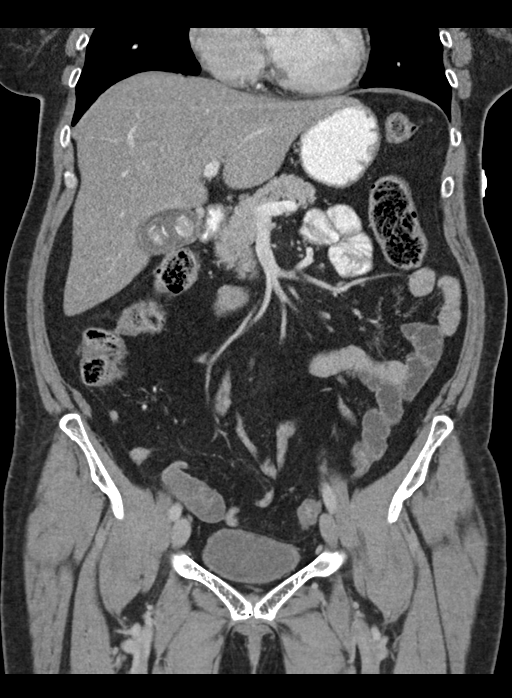
[im 78/140  soft-tissue]
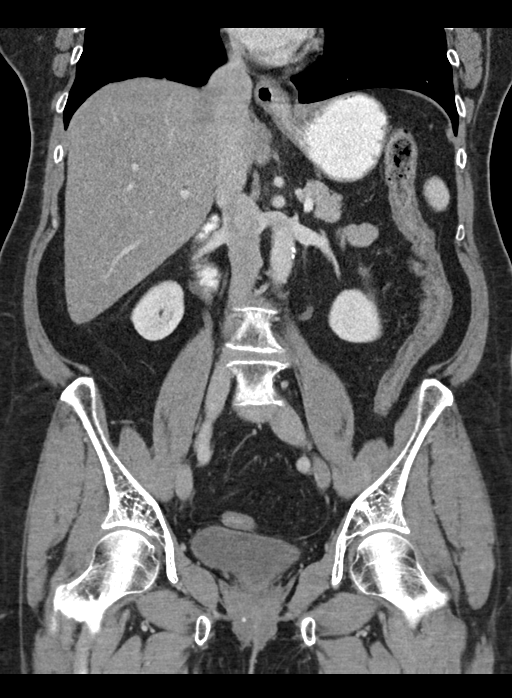

[15 of 46 positions shown; findings below may reference images not displayed]

FINDINGS: LUNG BASES: Included view of the lung bases are clear. Visualized
heart and pericardium are unremarkable.

SOLID ORGANS: The liver is diffusely hypodense consistent with
hepatic steatosis with mild focal fatty sparing about the
gallbladder fossa. Multiple gallstones measure up to 25 mm without
CT findings of acute cholecystitis. Pancreas and adrenal glands are
unremarkable. Spleen, gallbladder, pancreas and adrenal glands are
unremarkable.

GASTROINTESTINAL TRACT: Small hiatal hernia. The stomach, small and
large bowel are normal in course and caliber without inflammatory
changes. Mild colonic diverticulosis. Normal appendix.

KIDNEYS/ URINARY TRACT: Kidneys are orthotopic, demonstrating
symmetric enhancement. No nephrolithiasis, hydronephrosis or solid
renal masses. The unopacified ureters are normal in course and
caliber. Delayed imaging through the kidneys demonstrates symmetric
prompt contrast excretion within the proximal urinary collecting
system. Urinary bladder is partially distended and unremarkable.

PERITONEUM/RETROPERITONEUM: Aortoiliac vessels are normal in course
and caliber, mild calcific atherosclerosis. No lymphadenopathy by CT
size criteria. Status post hysterectomy. No intraperitoneal free
fluid nor free air.

SOFT TISSUE/OSSEOUS STRUCTURES: Non-suspicious. Grade 1 L2-3
retrolisthesis without spondylolysis. Moderate L4-5 and L5-S1 disc
height loss, vacuum disc consistent with degenerative disc resulting
in moderate to severe RIGHT L5-S1 neural foraminal narrowing.
IMPRESSION: Cholelithiasis without CT findings of acute cholecystitis though, if
clinically indicated, ultrasound would be more sensitive.

Hepatic steatosis.

## 2016-04-08 IMAGING — CR DG CHEST 2V
2 series · 2 of 2 positions shown · non-contrast
Comparison: Chest and abdomen 08/28/2006

CLINICAL DATA: Abdominal pain and right-sided chest pain.

EXAM:
CHEST  2 VIEW

[chest lat]
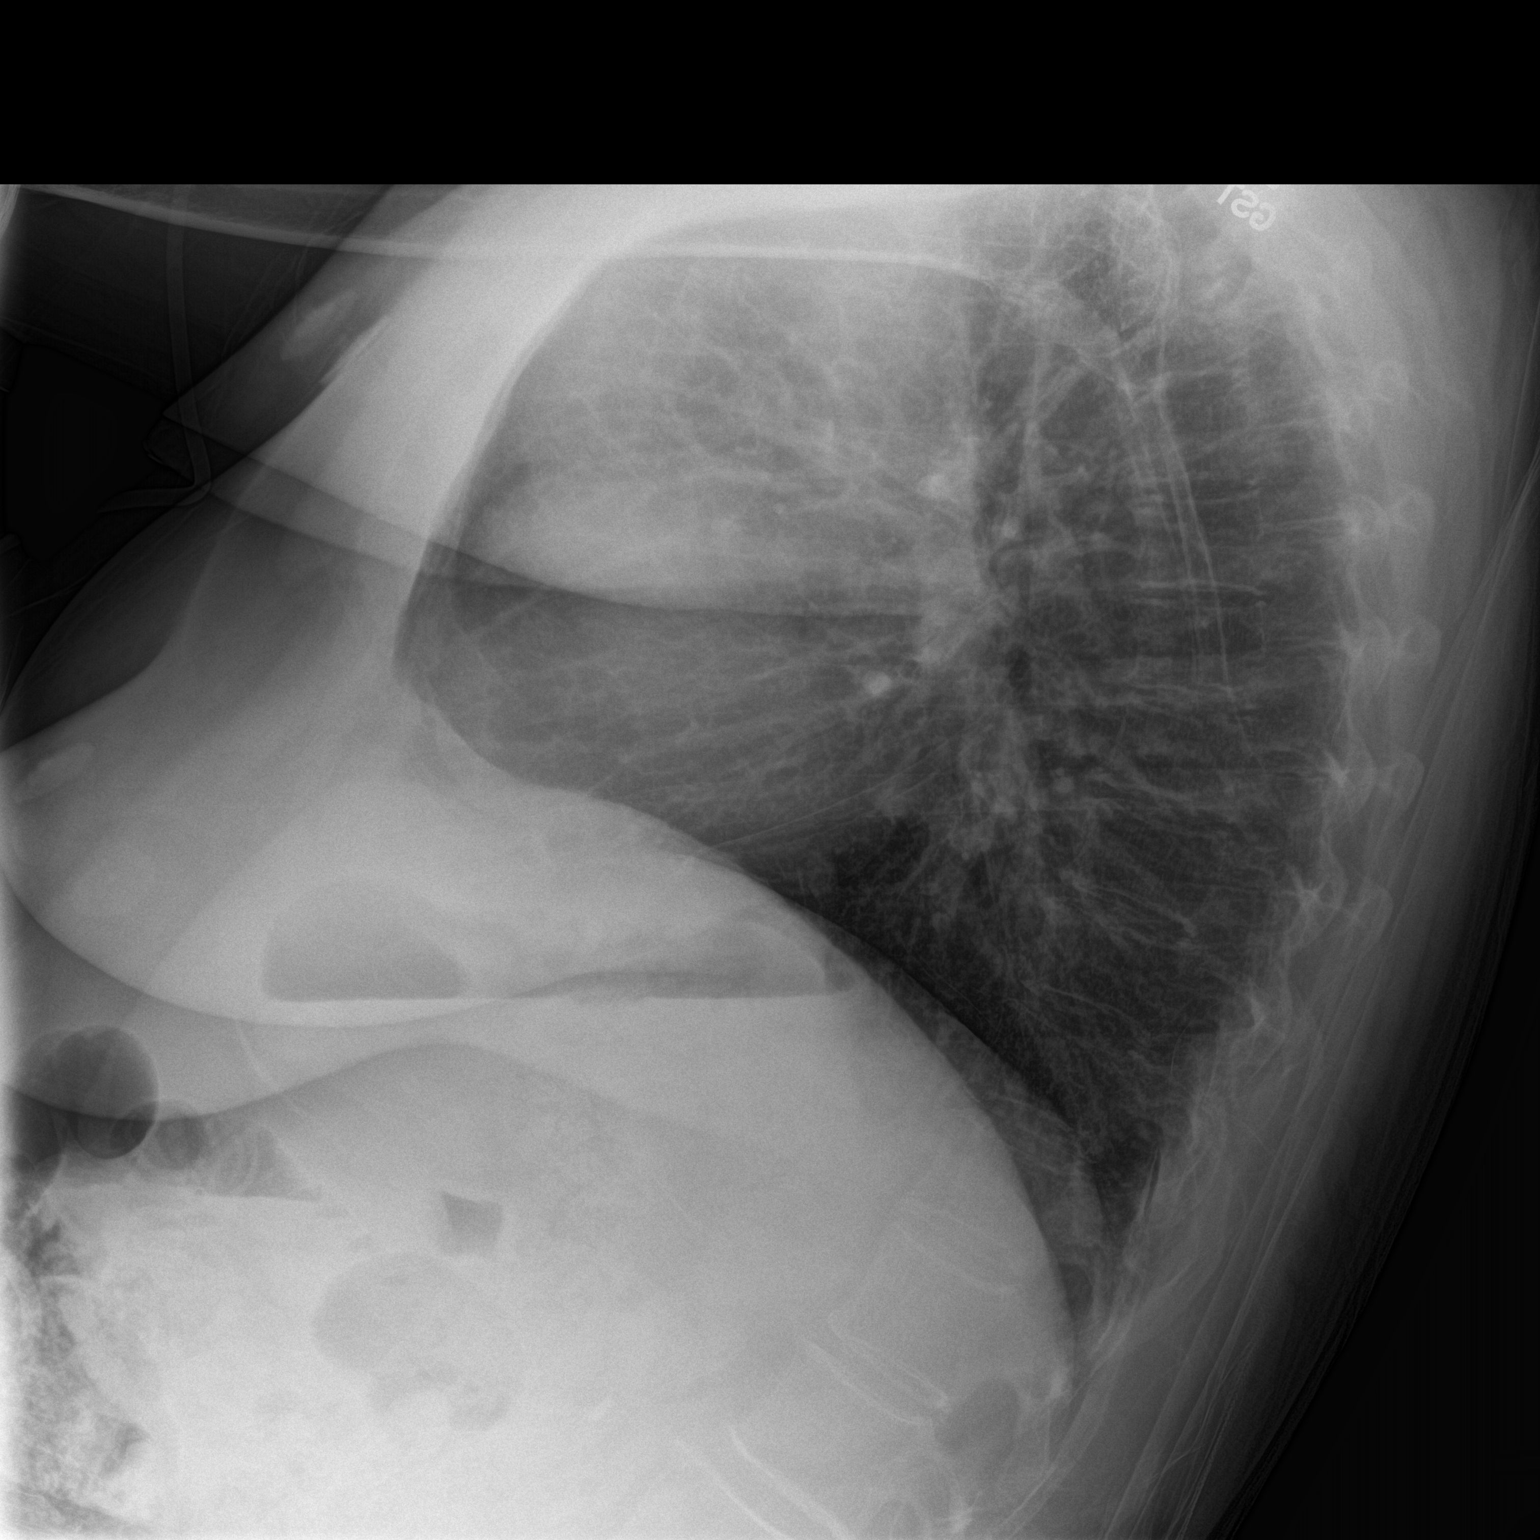

[chest ap]
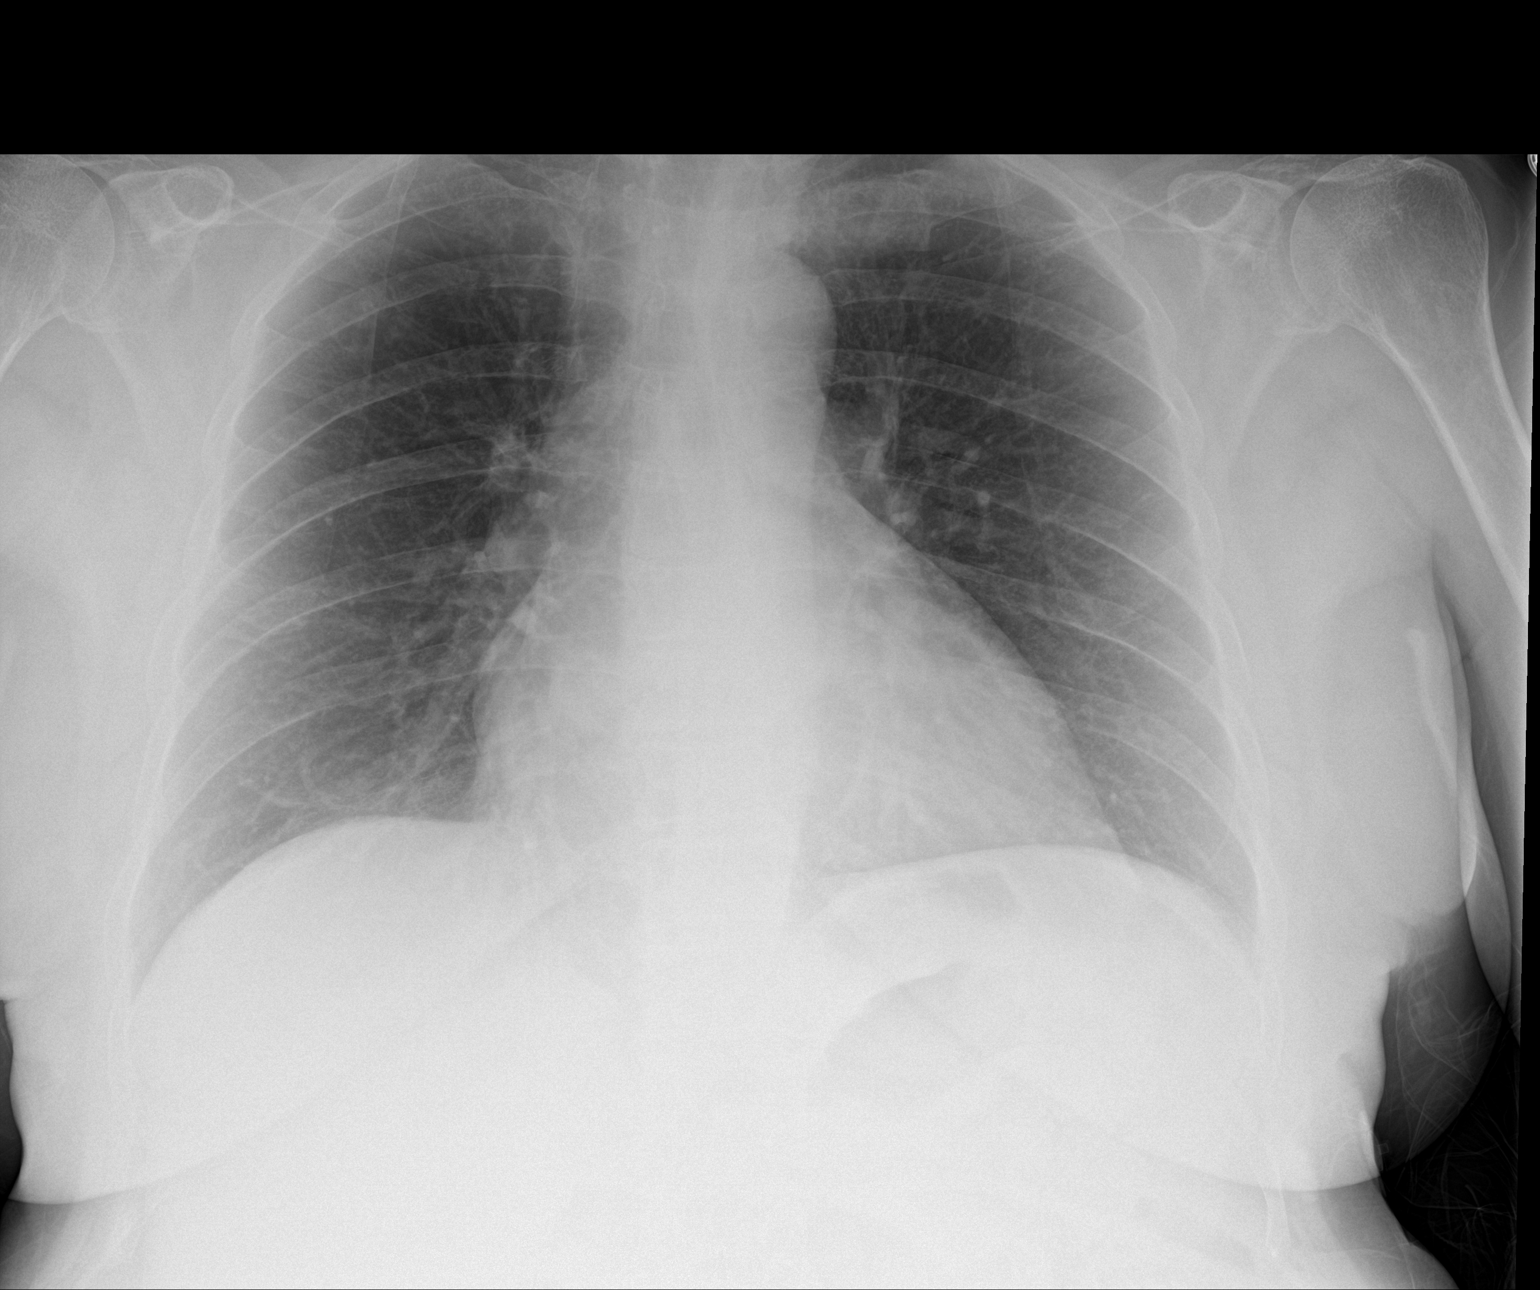

[2 of 2 positions shown; findings below may reference images not displayed]

FINDINGS: The heart size and mediastinal contours are within normal limits.
Both lungs are clear. The visualized skeletal structures are
unremarkable.
IMPRESSION: No active cardiopulmonary disease.

## 2016-06-21 IMAGING — US US ABDOMEN LIMITED
1 series · 14 of 25 positions shown · non-contrast
Comparison: CT abdomen and pelvis September 02, 2014 at [DATE] a.m.

CLINICAL DATA: RIGHT upper quadrant pain.

EXAM:
US ABDOMEN LIMITED - RIGHT UPPER QUADRANT

[Series 1: us abdomen limited · 0.22mm/px · 14 of 55 slices shown]
[im 1/55]
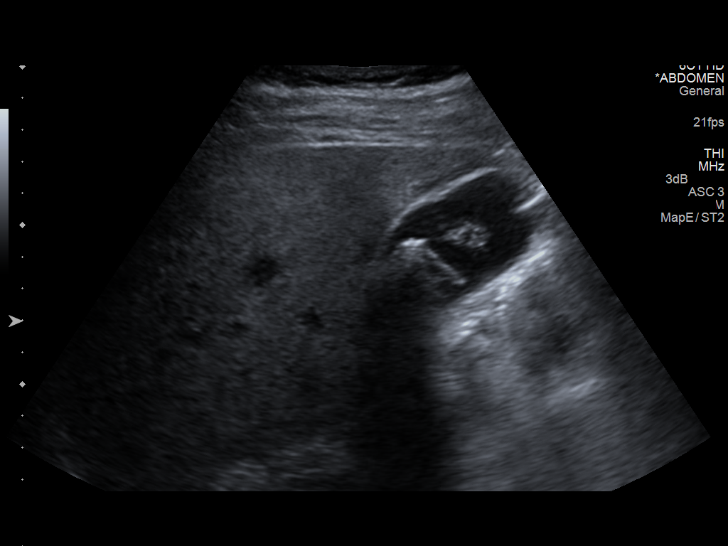
[im 5/55]
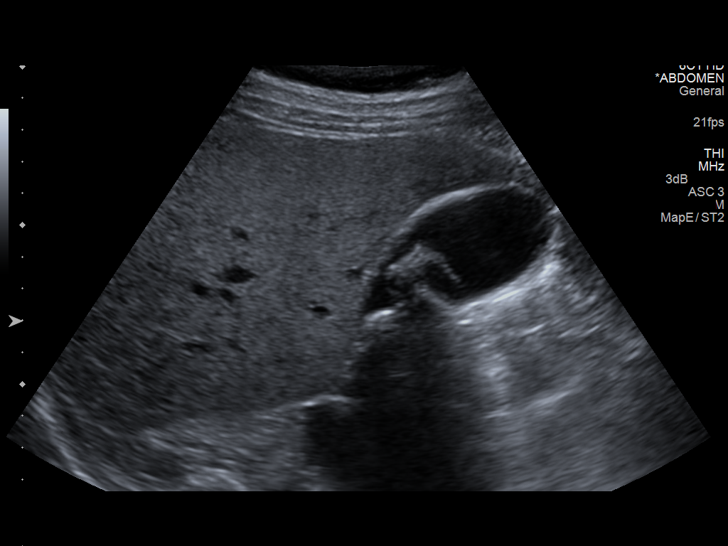
[im 10/55]
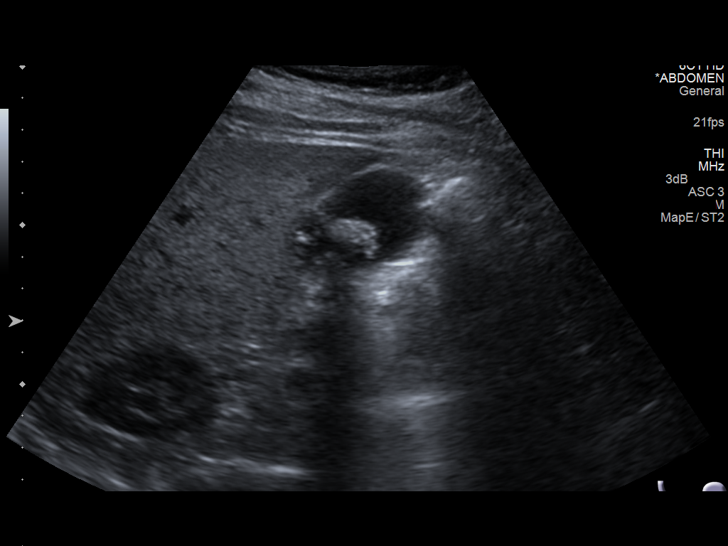
[im 14/55]
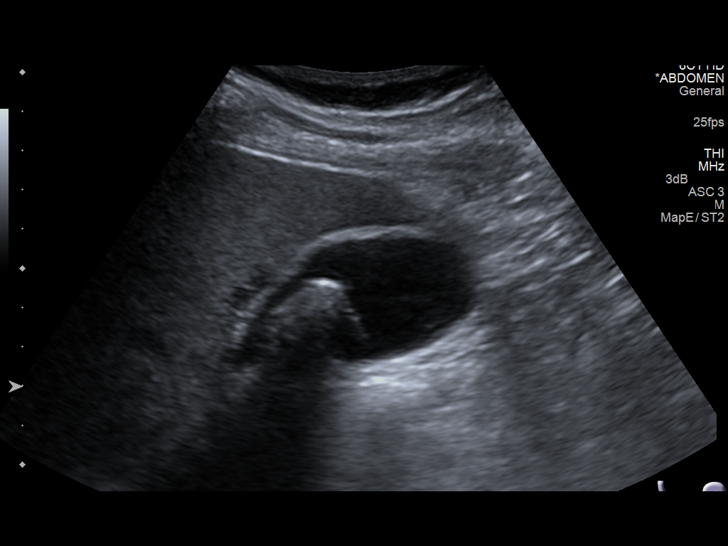
[im 19/55]
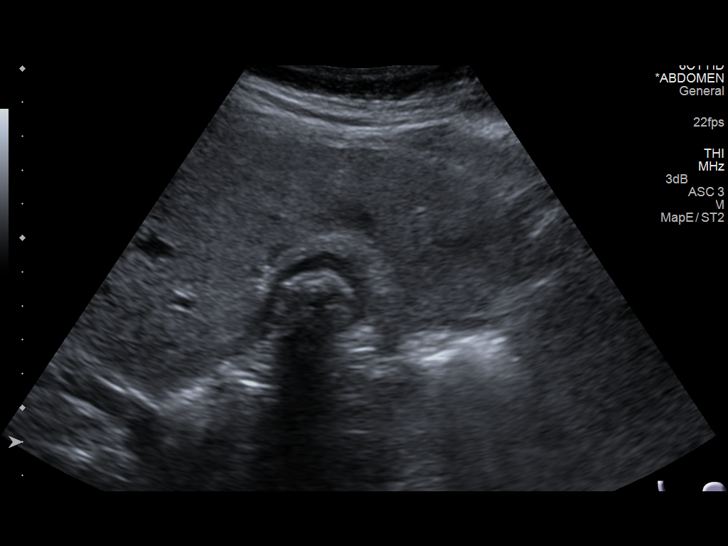
[im 21/55]
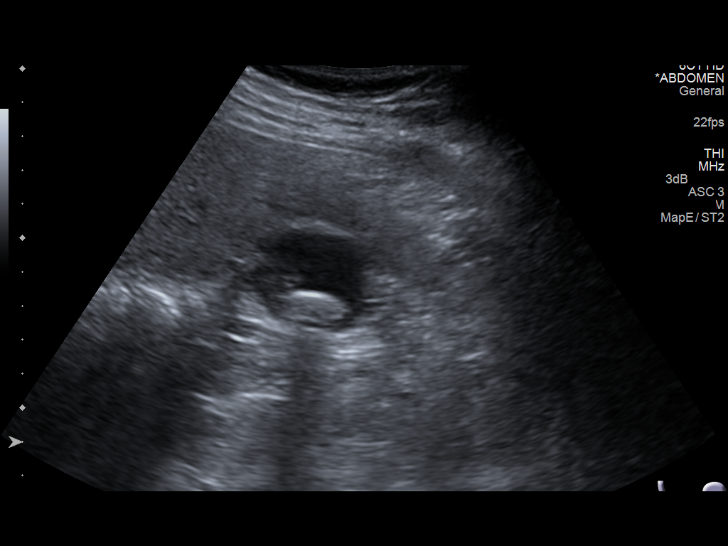
[im 25/55]
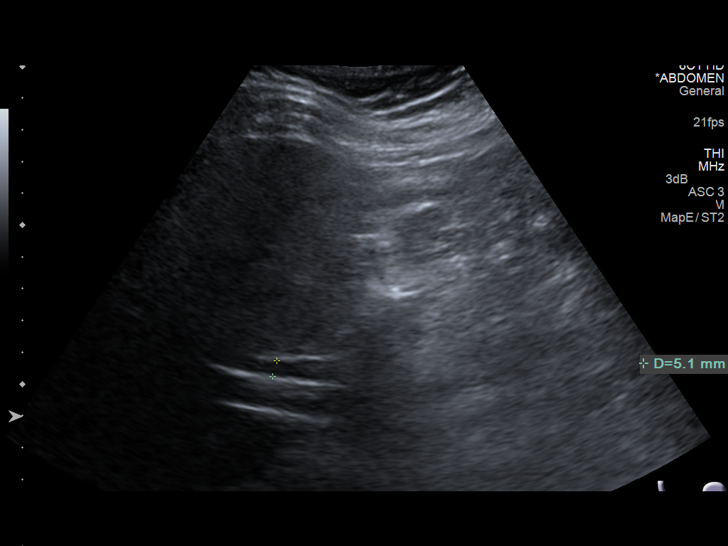
[im 30/55]
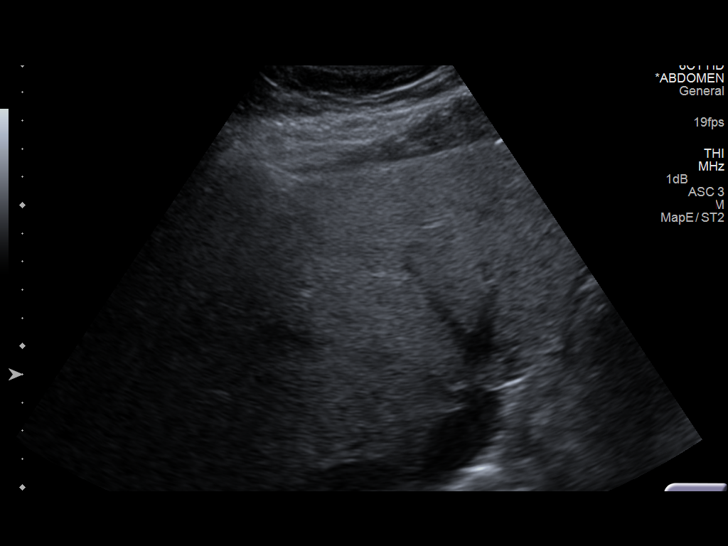
[im 34/55]
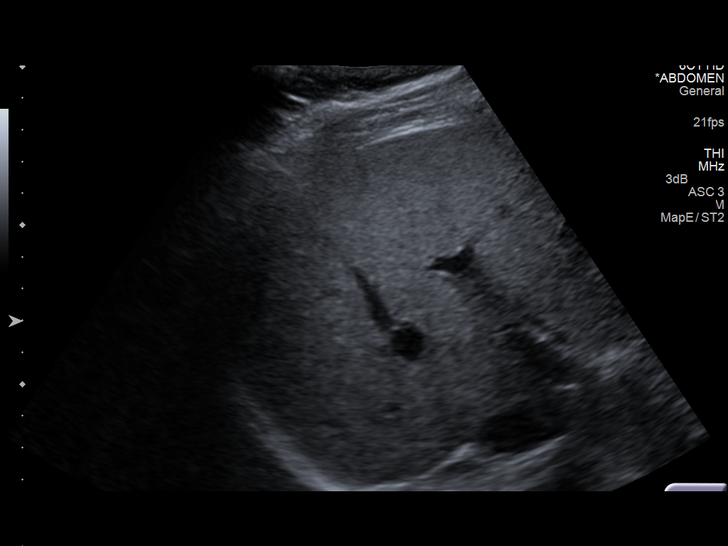
[im 37/55]
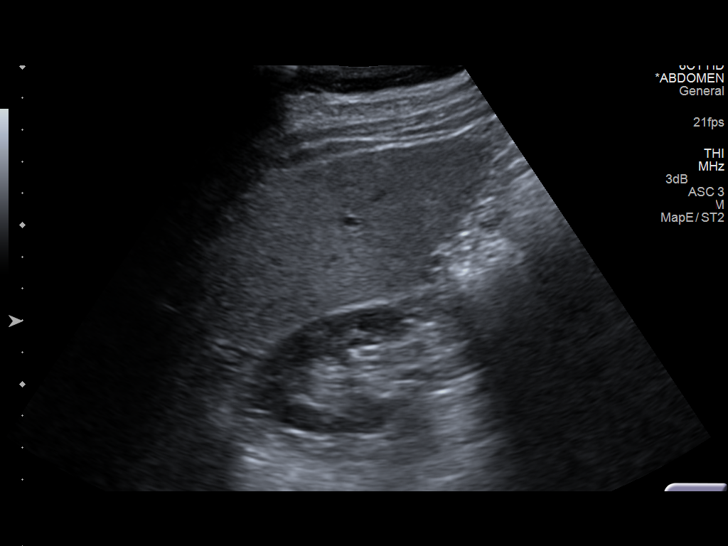
[im 41/55]
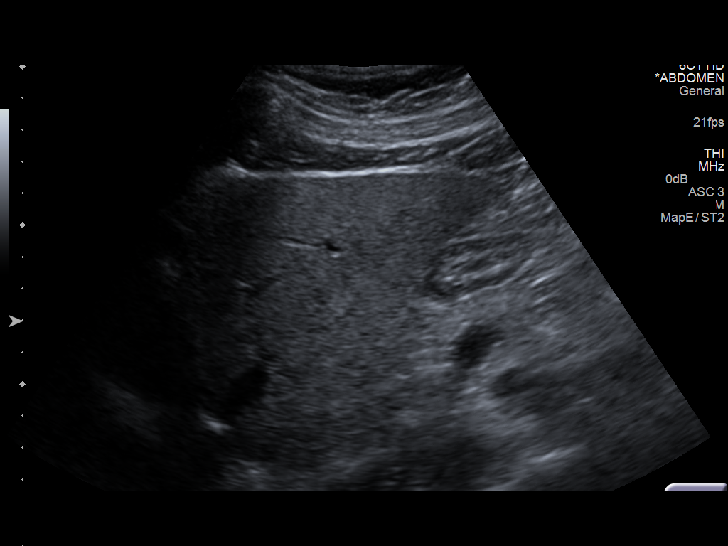
[im 46/55]
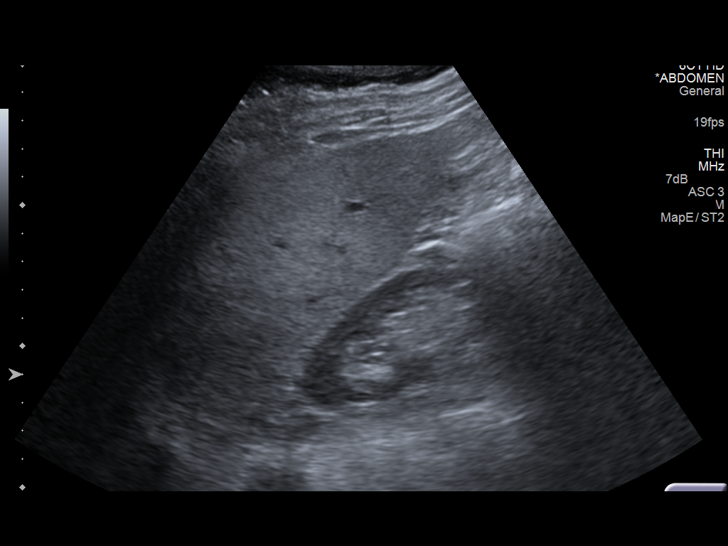
[im 50/55]
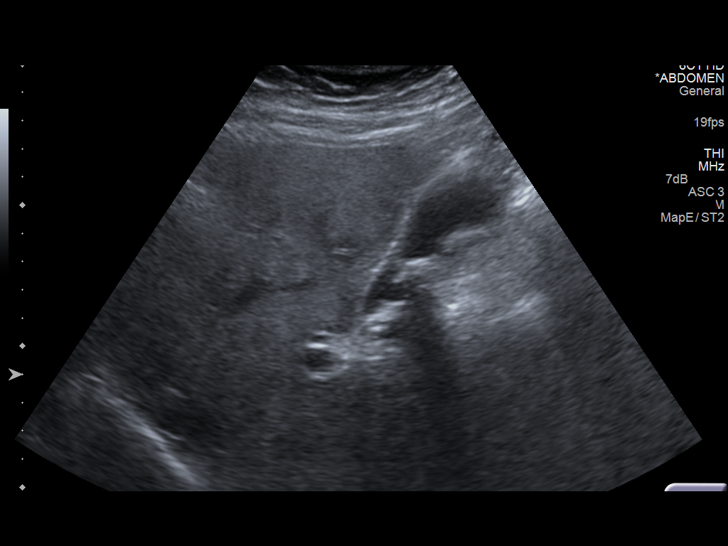
[im 55/55]
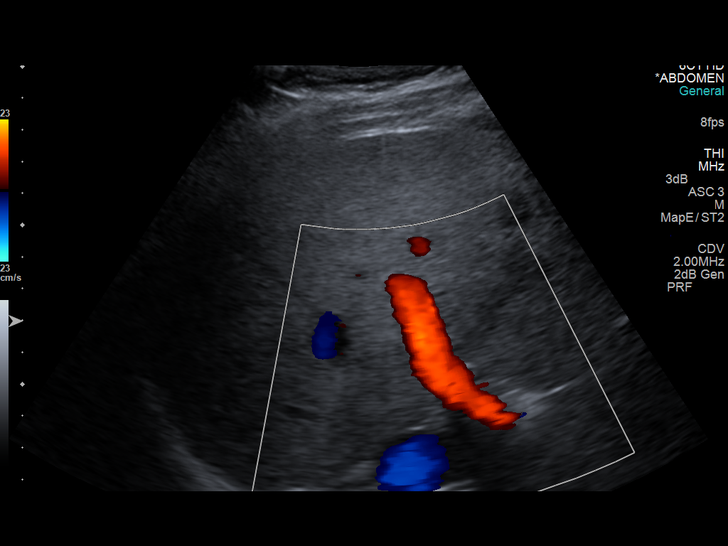

[14 of 25 positions shown; findings below may reference images not displayed]

FINDINGS: Gallbladder:

Multiple echogenic gallstones measure up to 2.2 cm, which appear
nonmobile at the neck. Borderline gallbladder wall thickening at 3
mm. Trace pericholecystic fluid. Sonographic Murphy's sign elicited.

Common bile duct:

Diameter: 5-6 mm.

Liver:

Echogenic liver consistent with hepatic steatosis with focal fatty
sparing about the gallbladder fossa. No intrahepatic biliary
dilatation. Hepatopetal portal vein.
IMPRESSION: Cholelithiasis and sonographic findings of acute cholecystitis.
